# Patient Record
Sex: Male | Born: 1960 | Race: White | Hispanic: No | Marital: Single | Smoking: Never smoker
Health system: Southern US, Community
[De-identification: ages and names within clinical notes are randomized; demographics above are authoritative.]

## PROBLEM LIST (undated history)

## (undated) DIAGNOSIS — I10 Essential (primary) hypertension: Secondary | ICD-10-CM

## (undated) DIAGNOSIS — R911 Solitary pulmonary nodule: Secondary | ICD-10-CM

## (undated) DIAGNOSIS — J189 Pneumonia, unspecified organism: Secondary | ICD-10-CM

## (undated) DIAGNOSIS — M109 Gout, unspecified: Secondary | ICD-10-CM

## (undated) DIAGNOSIS — Z87442 Personal history of urinary calculi: Secondary | ICD-10-CM

## (undated) DIAGNOSIS — E785 Hyperlipidemia, unspecified: Secondary | ICD-10-CM

## (undated) HISTORY — PX: APPENDECTOMY: SHX54

---

## 2009-09-27 ENCOUNTER — Emergency Department: Payer: Self-pay | Admitting: Emergency Medicine

## 2009-10-20 ENCOUNTER — Ambulatory Visit: Payer: Self-pay | Admitting: Urology

## 2009-11-28 ENCOUNTER — Ambulatory Visit: Payer: Self-pay

## 2010-11-11 ENCOUNTER — Ambulatory Visit: Payer: Self-pay | Admitting: Family Medicine

## 2010-11-15 ENCOUNTER — Ambulatory Visit: Payer: Self-pay | Admitting: Family Medicine

## 2011-12-15 ENCOUNTER — Ambulatory Visit: Payer: Self-pay | Admitting: Specialist

## 2012-04-27 ENCOUNTER — Ambulatory Visit: Payer: Self-pay | Admitting: Gastroenterology

## 2012-05-01 LAB — PATHOLOGY REPORT

## 2012-06-01 ENCOUNTER — Ambulatory Visit: Payer: Self-pay | Admitting: Orthopedic Surgery

## 2012-09-11 ENCOUNTER — Other Ambulatory Visit: Payer: Self-pay | Admitting: Rheumatology

## 2012-09-11 LAB — SYNOVIAL CELL COUNT + DIFF, W/ CRYSTALS
Basophil: 0 %
Eosinophil: 0 %
Neutrophils: 94 %
Nucleated Cell Count: 9968 /mm3
Other Cells BF: 0 %

## 2014-05-12 ENCOUNTER — Ambulatory Visit: Payer: Self-pay | Admitting: Family Medicine

## 2017-05-10 ENCOUNTER — Observation Stay (HOSPITAL_COMMUNITY)
Admission: EM | Admit: 2017-05-10 | Discharge: 2017-05-11 | Disposition: A | Discharge status: Home / Self Care | Payer: Managed Care, Other (non HMO) | Attending: Internal Medicine | Admitting: Internal Medicine

## 2017-05-10 ENCOUNTER — Emergency Department (HOSPITAL_COMMUNITY): Payer: Managed Care, Other (non HMO)

## 2017-05-10 ENCOUNTER — Encounter (HOSPITAL_COMMUNITY): Payer: Self-pay | Admitting: Cardiology

## 2017-05-10 DIAGNOSIS — I10 Essential (primary) hypertension: Secondary | ICD-10-CM | POA: Diagnosis not present

## 2017-05-10 DIAGNOSIS — E785 Hyperlipidemia, unspecified: Secondary | ICD-10-CM | POA: Diagnosis not present

## 2017-05-10 DIAGNOSIS — R079 Chest pain, unspecified: Secondary | ICD-10-CM | POA: Diagnosis not present

## 2017-05-10 DIAGNOSIS — I4510 Unspecified right bundle-branch block: Secondary | ICD-10-CM | POA: Insufficient documentation

## 2017-05-10 DIAGNOSIS — R072 Precordial pain: Secondary | ICD-10-CM | POA: Diagnosis not present

## 2017-05-10 DIAGNOSIS — R739 Hyperglycemia, unspecified: Secondary | ICD-10-CM | POA: Insufficient documentation

## 2017-05-10 DIAGNOSIS — Z79899 Other long term (current) drug therapy: Secondary | ICD-10-CM | POA: Diagnosis not present

## 2017-05-10 HISTORY — DX: Gout, unspecified: M10.9

## 2017-05-10 HISTORY — DX: Essential (primary) hypertension: I10

## 2017-05-10 HISTORY — DX: Hyperlipidemia, unspecified: E78.5

## 2017-05-10 HISTORY — DX: Solitary pulmonary nodule: R91.1

## 2017-05-10 LAB — BASIC METABOLIC PANEL
Anion gap: 11 (ref 5–15)
BUN: 13 mg/dL (ref 6–20)
CHLORIDE: 103 mmol/L (ref 101–111)
CO2: 26 mmol/L (ref 22–32)
CREATININE: 0.92 mg/dL (ref 0.61–1.24)
Calcium: 9.3 mg/dL (ref 8.9–10.3)
Glucose, Bld: 178 mg/dL — ABNORMAL HIGH (ref 65–99)
POTASSIUM: 3.5 mmol/L (ref 3.5–5.1)
SODIUM: 140 mmol/L (ref 135–145)

## 2017-05-10 LAB — HEMOGLOBIN A1C
Hgb A1c MFr Bld: 5.5 % (ref 4.8–5.6)
MEAN PLASMA GLUCOSE: 111.15 mg/dL

## 2017-05-10 LAB — HEPATIC FUNCTION PANEL
ALBUMIN: 4.3 g/dL (ref 3.5–5.0)
ALT: 18 U/L (ref 17–63)
AST: 21 U/L (ref 15–41)
Alkaline Phosphatase: 90 U/L (ref 38–126)
BILIRUBIN TOTAL: 1 mg/dL (ref 0.3–1.2)
Bilirubin, Direct: 0.2 mg/dL (ref 0.1–0.5)
Indirect Bilirubin: 0.8 mg/dL (ref 0.3–0.9)
TOTAL PROTEIN: 8.1 g/dL (ref 6.5–8.1)

## 2017-05-10 LAB — CBC
HCT: 41.7 % (ref 39.0–52.0)
Hemoglobin: 13.9 g/dL (ref 13.0–17.0)
MCH: 28.4 pg (ref 26.0–34.0)
MCHC: 33.3 g/dL (ref 30.0–36.0)
MCV: 85.1 fL (ref 78.0–100.0)
PLATELETS: 219 10*3/uL (ref 150–400)
RBC: 4.9 MIL/uL (ref 4.22–5.81)
RDW: 14.1 % (ref 11.5–15.5)
WBC: 10.5 10*3/uL (ref 4.0–10.5)

## 2017-05-10 LAB — LIPID PANEL
CHOL/HDL RATIO: 5.5 ratio
Cholesterol: 148 mg/dL (ref 0–200)
HDL: 27 mg/dL — AB (ref >40)
LDL Cholesterol: 105 mg/dL — ABNORMAL HIGH (ref 0–99)
TRIGLYCERIDES: 81 mg/dL (ref <150)
VLDL: 16 mg/dL (ref 0–40)

## 2017-05-10 LAB — TSH: TSH: 1.222 u[IU]/mL (ref 0.350–4.500)

## 2017-05-10 LAB — LIPASE, BLOOD: Lipase: 31 U/L (ref 11–51)

## 2017-05-10 LAB — GLUCOSE, CAPILLARY: Glucose-Capillary: 131 mg/dL — ABNORMAL HIGH (ref 65–99)

## 2017-05-10 LAB — TROPONIN I: Troponin I: <0.03 ng/mL (ref <0.03)

## 2017-05-10 MED ORDER — IOPAMIDOL (ISOVUE-370) INJECTION 76%
100.0000 mL | Freq: Once | INTRAVENOUS | Status: AC | PRN
  Administered 2017-05-10: 100 mL via INTRAVENOUS

## 2017-05-10 MED ORDER — GI COCKTAIL ~~LOC~~: 30.0000 mL | Freq: Four times a day (QID) | ORAL | Status: DC | PRN

## 2017-05-10 MED ORDER — INSULIN ASPART 100 UNIT/ML ~~LOC~~ SOLN: 0.0000 [IU] | Freq: Every day | SUBCUTANEOUS | Status: DC

## 2017-05-10 MED ORDER — INSULIN ASPART 100 UNIT/ML ~~LOC~~ SOLN: 0.0000 [IU] | Freq: Three times a day (TID) | SUBCUTANEOUS | Status: DC

## 2017-05-10 MED ORDER — GI COCKTAIL ~~LOC~~
30.0000 mL | Freq: Once | ORAL | Status: AC
Start: 2017-05-10 — End: 2017-05-10
  Administered 2017-05-10: 30 mL via ORAL
  Filled 2017-05-10: qty 30

## 2017-05-10 MED ORDER — MORPHINE SULFATE (PF) 2 MG/ML IV SOLN
2.0000 mg | Freq: Once | INTRAVENOUS | Status: AC
  Administered 2017-05-10: 2 mg via INTRAVENOUS
  Filled 2017-05-10: qty 1

## 2017-05-10 MED ORDER — SODIUM CHLORIDE 0.9 % IV BOLUS (SEPSIS)
500.0000 mL | Freq: Once | INTRAVENOUS | Status: AC
  Administered 2017-05-10: 500 mL via INTRAVENOUS

## 2017-05-10 MED ORDER — NITROGLYCERIN 0.4 MG SL SUBL
0.4000 mg | SUBLINGUAL_TABLET | SUBLINGUAL | Status: DC | PRN
  Administered 2017-05-10: 0.4 mg via SUBLINGUAL
  Filled 2017-05-10: qty 1

## 2017-05-10 MED ORDER — FEBUXOSTAT 40 MG PO TABS
40.0000 mg | ORAL_TABLET | Freq: Every day | ORAL | Status: DC
  Filled 2017-05-10: qty 1

## 2017-05-10 MED ORDER — ENOXAPARIN SODIUM 40 MG/0.4ML ~~LOC~~ SOLN
40.0000 mg | SUBCUTANEOUS | Status: DC
  Administered 2017-05-10: 40 mg via SUBCUTANEOUS
  Filled 2017-05-10: qty 0.4

## 2017-05-10 MED ORDER — ASPIRIN EC 81 MG PO TBEC
81.0000 mg | DELAYED_RELEASE_TABLET | Freq: Every day | ORAL | Status: DC
  Filled 2017-05-10: qty 1

## 2017-05-10 MED ORDER — HYDRALAZINE HCL 20 MG/ML IJ SOLN: 5.0000 mg | INTRAMUSCULAR | Status: DC | PRN

## 2017-05-10 MED ORDER — ATORVASTATIN CALCIUM 20 MG PO TABS
20.0000 mg | ORAL_TABLET | Freq: Every day | ORAL | Status: DC
  Administered 2017-05-10: 20 mg via ORAL
  Filled 2017-05-10: qty 1

## 2017-05-10 MED ORDER — ONDANSETRON HCL 4 MG/2ML IJ SOLN: 4.0000 mg | Freq: Four times a day (QID) | INTRAMUSCULAR | Status: DC | PRN

## 2017-05-10 MED ORDER — SODIUM CHLORIDE 0.9 % IV SOLN
INTRAVENOUS | Status: DC
  Administered 2017-05-10: 13:00:00 via INTRAVENOUS

## 2017-05-10 MED ORDER — LOSARTAN POTASSIUM 50 MG PO TABS
100.0000 mg | ORAL_TABLET | Freq: Every day | ORAL | Status: DC
  Filled 2017-05-10: qty 2

## 2017-05-10 MED ORDER — MORPHINE SULFATE (PF) 2 MG/ML IV SOLN: 2.0000 mg | INTRAVENOUS | Status: DC | PRN

## 2017-05-10 MED ORDER — ACETAMINOPHEN 325 MG PO TABS: 650.0000 mg | ORAL_TABLET | ORAL | Status: DC | PRN

## 2017-05-10 MED ORDER — ONDANSETRON HCL 4 MG/2ML IJ SOLN
4.0000 mg | Freq: Once | INTRAMUSCULAR | Status: AC
  Administered 2017-05-10: 4 mg via INTRAVENOUS
  Filled 2017-05-10: qty 2

## 2017-05-10 NOTE — ED Triage Notes (Signed)
Chest pain since 7 am.  Had the same pain over the weekend.

## 2017-05-10 NOTE — H&P (Signed)
History and Physical    Robert LinLeslie Riebe ZOX:[id number] DOB: Feb 10, 1961 DOA: 05/10/2017  PCP: Dione Housekeeperlmedo, Mario Ernesto, MD Consultants:  Meredeth IdeFleming - rheumatology Patient coming from:  Home - lives with son; Jackey LogeOK: Shari HeritageSon, [phone]  Chief Complaint: chest pain  HPI: Robert Ross is a 56 y.o. male with medical history significant of lung nodule; HTN; HLD; and gout presenting with chest pain.  He started having chest pain and upper back pain and he was concerned about it.  He had the same problem over the weekend, took 2 ASA and it resolved.  He had a second episode just before bed one night this week and took 1 ASA; he slept well and the pain was gone when he awoke from sleep.    Today it flared again and he took 1 ASA without improvement.  He identifies the midepigastric region as the source of his pain.  He was given morphine, nitroglycerin with some relief in his back; he fell asleep and when he woke up, his chest was still hurting.  No h/o chest pain.  No stress test.  Radiates into between shoulder blades.  Nausea today, no vomiting.  +diaphoresis today.  Nothing makes it worse or better.  He was at work when the pain started.  He did not have pain while driving.  Pain started with exertion.  Pain has been constant since it started.   ED Course: EKG unchanged.  Troponin negative x 2.  CTA negative.  Morphine and NTG gave some relief but the pain persisted.  Review of Systems: As per HPI; otherwise review of systems reviewed and negative.   Ambulatory Status:  Ambulates without assistance  Past Medical History:  Diagnosis Date  . Gout   . Hyperlipidemia   . Hypertension   . Lung nodule    stable for several years    Past Surgical History:  Procedure Laterality Date  . APPENDECTOMY      Social History   Social History  . Marital status: Married    Spouse name: N/A  . Number of children: N/A  . Years of education: N/A   Occupational History  . Scientist, product/process developmentTextile worker    Social History Main  Topics  . Smoking status: Never Smoker  . Smokeless tobacco: Never Used  . Alcohol use No  . Drug use: No  . Sexual activity: Not on file   Other Topics Concern  . Not on file   Social History Narrative  . No narrative on file    Allergies  Allergen Reactions  . Allopurinol Rash    Family History  Problem Relation Age of Onset  . Lung cancer Mother 2073  . CAD Father 3960    Prior to Admission medications   Medication Sig Start Date End Date Taking? Authorizing Provider  atorvastatin (LIPITOR) 20 MG tablet Take 1 tablet by mouth daily. 04/19/17  Yes [provider]  losartan (COZAAR) 100 MG tablet Take 1 tablet by mouth daily. 03/18/17  Yes [provider]  ULORIC 40 MG tablet Take 1 tablet by mouth daily. 04/18/17  Yes [provider]    Physical Exam: Vitals:   05/10/17 1430 05/10/17 1500 05/10/17 1515 05/10/17 1530  BP: (!) 155/58 (!) 176/76  (!) 164/69  Pulse: (!) 36  (!) 57   Resp:    18  Temp:      TempSrc:      SpO2: 99% 98% 96% 98%  Weight:      Height:  General:  Appears calm and comfortable and is NAD Eyes:  PERRL, EOMI, normal lids, iris ENT:  grossly normal hearing, lips & tongue, mmm; appropriate dentition Neck:  no LAD, masses or thyromegaly; no carotid bruits Cardiovascular:  Bigeminy, rate controlled, no m/r/g. No LE edema.  Respiratory:   CTA bilaterally with no wheezes/rales/rhonchi.  Normal respiratory effort. Abdomen:  soft, NT, ND, NABS Back:   normal alignment, no CVAT Skin:  no rash or induration seen on limited exam Musculoskeletal:  grossly normal tone BUE/BLE, good ROM, no bony abnormality Psychiatric:  grossly normal mood and affect, speech fluent and appropriate, AOx3 Neurologic:  CN 2-12 grossly intact, moves all extremities in coordinated fashion, sensation intact    Radiological Exams on Admission: Dg Chest 2 View  Result Date: 05/10/2017 CLINICAL DATA:  Chest EXAM: CHEST  2 VIEW COMPARISON:   06/23/2016 FINDINGS: No cardiomegaly when accounting for rotation and apical fat pad. Subpleural density along the lateral left chest wall, stable from CT scanograms in 2012 and 2013. Similar findings were reported on 06/23/2016, which is not available for review (2016 and 2015 chest x-rays are also no longer available). There is a calcified pleural plaque along the anterolateral left chest wall at this level by CT. There is no edema, consolidation, effusion, or pneumothorax. IMPRESSION: 1. No acute finding. 2. Chronic subpleural density in the lateral left chest favoring scar. Electronically Signed   By: Marnee Spring M.D.   On: 05/10/2017 11:43   Ct Angio Chest Pe W/cm &/or Wo Cm  Result Date: 05/10/2017 CLINICAL DATA:  Chest pain, shortness of Breath EXAM: CT ANGIOGRAPHY CHEST WITH CONTRAST TECHNIQUE: Multidetector CT imaging of the chest was performed using the standard protocol during bolus administration of intravenous contrast. Multiplanar CT image reconstructions and MIPs were obtained to evaluate the vascular anatomy. CONTRAST:  100 cc Isovue 370 IV COMPARISON:  11/15/2010 FINDINGS: Cardiovascular: Heart is borderline in size. Aorta is normal caliber. No filling defects in the pulmonary arteries to suggest pulmonary emboli. Mediastinum/Nodes: No mediastinal, hilar, or axillary adenopathy. Trachea and esophagus are unremarkable. Lungs/Pleura: Mild vascular congestion. Scattered ground-glass opacities in the lungs could reflect early edema or atelectasis. No effusions. Upper Abdomen: Small gallstones layering in the gallbladder. No acute findings. Musculoskeletal: Chest wall soft tissues are unremarkable. No acute bony abnormality. Review of the MIP images confirms the above findings. IMPRESSION: No evidence of pulmonary embolus. Borderline heart size. Mild vascular congestion. Ground-glass opacities could reflect early edema or atelectasis. Cholelithiasis. Electronically Signed   By: Charlett Nose M.D.    On: 05/10/2017 15:00    EKG: Independently reviewed.  Bigeminy with rate 71; RBBB with no evidence of acute ischemia   Labs on Admission: I have personally reviewed the available labs and imaging studies at the time of the admission.  Pertinent labs:   Glucose 178 Troponin <0.03 x 2 WBC 10.5  Assessment/Plan Principal Problem:   Chest pain Active Problems:   Essential hypertension   Hyperlipidemia   Hyperglycemia   Chest pain -Patient with substernal chest pain vs. midepigastric pain that has come on intermittently for days.  It appeared to be exertional today but has been constant and not relieved with morphine or NTG. -Symptoms suggestive of noncardiac chest pain.  -CXR unremarkable.   -Initial cardiac troponin negative x 2.  -EKG not indicative of acute ischemia but does show bigeminy (also noted on telemetry/exam).   -GRACE score is 61; which predicts an in-hospital death rate of 0.2%.  -Will plan to  place in observation status on telemetry to rule out ACS by overnight observation.  -cycle troponin q6h x 3 and repeat EKG in AM -Start ASA 81 mg  daily -morphine given -Risk factor stratification with HgbA1c and FLP; will also check TSH and UDS -Cardiology consultation in AM - NPO for possible stress test  -Will plan to start Heparin drip if enzymes are positive and/or EKG changes  HTN -Takes Cozaar monotherapy at home -Patient with suboptimal control while in the ER -Reluctant to add beta blocker given bigeminy and intermittent bradycardia -Will add prn hydralazine  HLD -Continue Lipitor -Check lipids  Hyperglycemia -Glucose 178 -Check A1c -There is no indication to start medication at this time, but will cover with moderate-scale SSI for now -It appears fairly likely that he will need treatment for DM    DVT prophylaxis:  Lovenox  Code Status: Full - confirmed with patient/family Family Communication: Son present throughout hospitalization Disposition  Plan:  Home once clinically improved Consults called: Cardiology (AM)  Admission status: It is my clinical opinion that referral for OBSERVATION is reasonable and necessary in this patient based on the above information provided. The aforementioned taken together are felt to place the patient at high risk for further clinical deterioration. However it is anticipated that the patient may be medically stable for discharge from the hospital within 24 to 48 hours.    Jonah Blue MD [facility]  If note is complete, please contact covering daytime or nighttime physician. [email or web address] Password [facility]  05/10/2017, 6:21 PM

## 2017-05-10 NOTE — ED Provider Notes (Signed)
[facility]-EMERGENCY DEPT Provider Note   CSN: [id number] Arrival date & time: 05/10/17  1046     History   Chief Complaint Chief Complaint  Patient presents with  . Chest Pain    HPI Robert Ross is a 56 y.o. male.   Patient with onset of substernal chest pain today at 8 in the morning that radiated to the back between the shoulder blades. Patient states the pain is 8 out of 10. Patient had similar pain over the weekend but not as intense. Then resolved. Patient without history of any chest pain other than what occurred here recently. Cardiac risk factors include hypertension. Nonsmoker. No history of premature coronary artery disease in the family. Patient without nausea vomiting no lower extremity swelling no shortness of breath. Patient did take a full aspirin earlier today.        Past Medical History:  Diagnosis Date  . Gout   . Hypertension     There are no active problems to display for this patient.   Past Surgical History:  Procedure Laterality Date  . APPENDECTOMY         Home Medications    Prior to Admission medications   Medication Sig Start Date End Date Taking? Authorizing Provider  atorvastatin (LIPITOR) 20 MG tablet Take 1 tablet by mouth daily. 04/19/17  Yes [provider]  losartan (COZAAR) 100 MG tablet Take 1 tablet by mouth daily. 03/18/17  Yes [provider]  ULORIC 40 MG tablet Take 1 tablet by mouth daily. 04/18/17  Yes [provider]    Family History History reviewed. No pertinent family history.  Social History Social History  Substance Use Topics  . Smoking status: Never Smoker  . Smokeless tobacco: Never Used  . Alcohol use No     Allergies   Allopurinol   Review of Systems Review of Systems  Constitutional: Negative for fever.  HENT: Negative for congestion.   Eyes: Negative for redness.  Respiratory: Negative for shortness of breath.   Cardiovascular: Positive for chest pain. Negative for  leg swelling.  Gastrointestinal: Negative for abdominal pain and nausea.  Genitourinary: Negative for dysuria.  Musculoskeletal: Positive for back pain.  Skin: Negative for rash.  Neurological: Negative for headaches.  Hematological: Does not bruise/bleed easily.  Psychiatric/Behavioral: Negative for confusion.     Physical Exam Updated Vital Signs BP (!) 164/69   Pulse (!) 57   Temp 97.9 F (36.6 C) (Oral)   Resp 18   Ht 1.88 m (6\' 2" )   Wt 122.5 kg (270 lb)   SpO2 98%   BMI 34.67 kg/m   Physical Exam  Constitutional: He is oriented to person, place, and time. He appears well-developed and well-nourished. No distress.  HENT:  Head: Normocephalic and atraumatic.  Mouth/Throat: Oropharynx is clear and moist.  Eyes: Pupils are equal, round, and reactive to light. Conjunctivae and EOM are normal.  Neck: Normal range of motion. Neck supple.  Cardiovascular: Normal rate, regular rhythm and normal heart sounds.   Pulmonary/Chest: Effort normal and breath sounds normal.  Abdominal: Soft. Bowel sounds are normal.  Musculoskeletal: Normal range of motion. He exhibits no edema.  Neurological: He is alert and oriented to person, place, and time. No cranial nerve deficit or sensory deficit. He exhibits normal muscle tone. Coordination normal.  Skin: Skin is warm.  Nursing note and vitals reviewed.    ED Treatments / Results  Labs (all labs ordered are listed, but only abnormal results are displayed) Labs Reviewed  BASIC METABOLIC PANEL - Abnormal; Notable for the following:       Result Value   Glucose, Bld 178 (*)    All other components within normal limits  CBC  TROPONIN I  HEPATIC FUNCTION PANEL  LIPASE, BLOOD  TROPONIN I    EKG  EKG Interpretation  Date/Time:  Wednesday May 10 2017 10:59:48 EDT Ventricular Rate:  64 PR Interval:  160 QRS Duration: 134 QT Interval:  482 QTC Calculation: 497 R Axis:   36 Text Interpretation:  Sinus rhythm with Premature  atrial complexes in a pattern of bigeminy Right bundle branch block Abnormal ECG New since previous tracing Confirmed by Vanetta Mulders [phone]) on 05/10/2017 12:14:48 PM       Radiology Dg Chest 2 View  Result Date: 05/10/2017 CLINICAL DATA:  Chest EXAM: CHEST  2 VIEW COMPARISON:  06/23/2016 FINDINGS: No cardiomegaly when accounting for rotation and apical fat pad. Subpleural density along the lateral left chest wall, stable from CT scanograms in 2012 and 2013. Similar findings were reported on 06/23/2016, which is not available for review (2016 and 2015 chest x-rays are also no longer available). There is a calcified pleural plaque along the anterolateral left chest wall at this level by CT. There is no edema, consolidation, effusion, or pneumothorax. IMPRESSION: 1. No acute finding. 2. Chronic subpleural density in the lateral left chest favoring scar. Electronically Signed   By: Marnee Spring M.D.   On: 05/10/2017 11:43   Ct Angio Chest Pe W/cm &/or Wo Cm  Result Date: 05/10/2017 CLINICAL DATA:  Chest pain, shortness of Breath EXAM: CT ANGIOGRAPHY CHEST WITH CONTRAST TECHNIQUE: Multidetector CT imaging of the chest was performed using the standard protocol during bolus administration of intravenous contrast. Multiplanar CT image reconstructions and MIPs were obtained to evaluate the vascular anatomy. CONTRAST:  100 cc Isovue 370 IV COMPARISON:  11/15/2010 FINDINGS: Cardiovascular: Heart is borderline in size. Aorta is normal caliber. No filling defects in the pulmonary arteries to suggest pulmonary emboli. Mediastinum/Nodes: No mediastinal, hilar, or axillary adenopathy. Trachea and esophagus are unremarkable. Lungs/Pleura: Mild vascular congestion. Scattered ground-glass opacities in the lungs could reflect early edema or atelectasis. No effusions. Upper Abdomen: Small gallstones layering in the gallbladder. No acute findings. Musculoskeletal: Chest wall soft tissues are unremarkable. No acute  bony abnormality. Review of the MIP images confirms the above findings. IMPRESSION: No evidence of pulmonary embolus. Borderline heart size. Mild vascular congestion. Ground-glass opacities could reflect early edema or atelectasis. Cholelithiasis. Electronically Signed   By: Charlett Nose M.D.   On: 05/10/2017 15:00    Procedures Procedures (including critical care time)  Medications Ordered in ED Medications  0.9 %  sodium chloride infusion ( Intravenous New Bag/Given 05/10/17 1250)  nitroGLYCERIN (NITROSTAT) SL tablet 0.4 mg (0.4 mg Sublingual Given 05/10/17 1251)  sodium chloride 0.9 % bolus 500 mL (0 mLs Intravenous Stopped 05/10/17 1538)  ondansetron (ZOFRAN) injection 4 mg (4 mg Intravenous Given 05/10/17 1251)  morphine 2 MG/ML injection 2 mg (2 mg Intravenous Given 05/10/17 1251)  iopamidol (ISOVUE-370) 76 % injection 100 mL (100 mLs Intravenous Contrast Given 05/10/17 1450)     Initial Impression / Assessment and Plan / ED Course  I have reviewed the triage vital signs and the nursing notes.  Pertinent labs & imaging results that were available during my care of the patient were reviewed by me and considered in my medical decision making (see chart for details).    Patient's workup for the chest pain without  acute findings on the EKG however his EKG is changed from previous. Has right bundle branch block has some PACs. First troponin negative.Chest x-ray with evidence of an old scar CT angios was done to rule out pulmonary embolus. No evidence of that. No evidence of any dissection on the CTA of the chest.  Patient got nitroglycerin and morphine here with some improvement in the chest pain but is not resolved. Second troponin is been ordered is not back yet. Patient will require admission for rule out.   Final Clinical Impressions(s) / ED Diagnoses   Final diagnoses:  Precordial pain    New Prescriptions New Prescriptions   No medications on file     Vanetta Mulders,  MD 05/10/17 [phone]

## 2017-05-10 NOTE — ED Notes (Signed)
Pt taken to xray 

## 2017-05-10 NOTE — ED Notes (Signed)
Patient transported to CT 

## 2017-05-10 NOTE — ED Notes (Signed)
EKG given to dr Clarene Dukemcmanus

## 2017-05-10 NOTE — ED Notes (Addendum)
Radial pulse palpated for HR reading. Pulse strong but irregular.

## 2017-05-11 ENCOUNTER — Observation Stay (HOSPITAL_BASED_OUTPATIENT_CLINIC_OR_DEPARTMENT_OTHER): Payer: Managed Care, Other (non HMO)

## 2017-05-11 ENCOUNTER — Encounter (HOSPITAL_COMMUNITY): Payer: Self-pay | Admitting: Adult Health

## 2017-05-11 DIAGNOSIS — E785 Hyperlipidemia, unspecified: Secondary | ICD-10-CM | POA: Diagnosis not present

## 2017-05-11 DIAGNOSIS — E784 Other hyperlipidemia: Secondary | ICD-10-CM

## 2017-05-11 DIAGNOSIS — I10 Essential (primary) hypertension: Secondary | ICD-10-CM

## 2017-05-11 DIAGNOSIS — R072 Precordial pain: Secondary | ICD-10-CM | POA: Diagnosis not present

## 2017-05-11 DIAGNOSIS — M549 Dorsalgia, unspecified: Secondary | ICD-10-CM

## 2017-05-11 DIAGNOSIS — R739 Hyperglycemia, unspecified: Secondary | ICD-10-CM

## 2017-05-11 DIAGNOSIS — R079 Chest pain, unspecified: Secondary | ICD-10-CM

## 2017-05-11 DIAGNOSIS — R9431 Abnormal electrocardiogram [ECG] [EKG]: Secondary | ICD-10-CM

## 2017-05-11 DIAGNOSIS — E782 Mixed hyperlipidemia: Secondary | ICD-10-CM

## 2017-05-11 LAB — NM MYOCAR MULTI W/SPECT W/WALL MOTION / EF
CHL CUP NUCLEAR SRS: 0
CHL CUP NUCLEAR SSS: 1
CSEPPHR: 104 {beats}/min
LV dias vol: 119 mL (ref 62–150)
LV sys vol: 37 mL
NUC STRESS TID: 1.1
RATE: 0.29
Rest HR: 73 {beats}/min
SDS: 1

## 2017-05-11 LAB — ECHOCARDIOGRAM COMPLETE
AVLVOTPG: 8 mmHg
CHL CUP MV DEC (S): 225
CHL CUP STROKE VOLUME: 52 mL
E/e' ratio: 8.48
EWDT: 225 ms
FS: 45 % — AB (ref 28–44)
Height: 74 in
IVS/LV PW RATIO, ED: 0.99
LA ID, A-P, ES: 29 mm
LA diam end sys: 29 mm
LA vol index: 16.5 mL/m2
LADIAMINDEX: 1.13 cm/m2
LAVOL: 42.4 mL
LAVOLA4C: 37.4 mL
LDCA: 3.14 cm2
LV E/e' medial: 8.48
LV E/e'average: 8.48
LV PW d: 12.8 mm — AB (ref 0.6–1.1)
LV TDI E'LATERAL: 9.14
LV TDI E'MEDIAL: 8.27
LVDIAVOL: 73 mL (ref 62–150)
LVDIAVOLIN: 29 mL/m2
LVELAT: 9.14 cm/s
LVOT SV: 90 mL
LVOT VTI: 28.6 cm
LVOT peak vel: 141 cm/s
LVOTD: 20 mm
LVSYSVOL: 22 mL (ref 21–61)
LVSYSVOLIN: 8 mL/m2
MV Peak grad: 2 mmHg
MVPKAVEL: 75.1 m/s
MVPKEVEL: 77.5 m/s
RV TAPSE: 20.3 mm
Simpson's disk: 70
Weight: 4320 oz

## 2017-05-11 LAB — GLUCOSE, CAPILLARY
GLUCOSE-CAPILLARY: 86 mg/dL (ref 65–99)
Glucose-Capillary: 125 mg/dL — ABNORMAL HIGH (ref 65–99)
Glucose-Capillary: 100 mg/dL — ABNORMAL HIGH (ref 65–99)

## 2017-05-11 LAB — TROPONIN I: Troponin I: <0.03 ng/mL (ref <0.03)

## 2017-05-11 LAB — HIV ANTIBODY (ROUTINE TESTING W REFLEX): HIV SCREEN 4TH GENERATION: NONREACTIVE

## 2017-05-11 MED ORDER — ENOXAPARIN SODIUM 60 MG/0.6ML ~~LOC~~ SOLN: 60.0000 mg | SUBCUTANEOUS | Status: DC

## 2017-05-11 MED ORDER — CHLORTHALIDONE 25 MG PO TABS
12.5000 mg | ORAL_TABLET | Freq: Every day | ORAL | Status: DC
  Filled 2017-05-11 (×2): qty 0.5

## 2017-05-11 MED ORDER — TECHNETIUM TC 99M TETROFOSMIN IV KIT
30.0000 | PACK | Freq: Once | INTRAVENOUS | Status: AC | PRN
  Administered 2017-05-11: 30 via INTRAVENOUS

## 2017-05-11 MED ORDER — TECHNETIUM TC 99M TETROFOSMIN IV KIT
10.0000 | PACK | Freq: Once | INTRAVENOUS | Status: AC | PRN
  Administered 2017-05-11: 10 via INTRAVENOUS

## 2017-05-11 MED ORDER — ASPIRIN 81 MG PO TBEC: 81.0000 mg | DELAYED_RELEASE_TABLET | Freq: Every day | ORAL | 0 refills | Status: AC

## 2017-05-11 MED ORDER — SODIUM CHLORIDE 0.9% FLUSH
INTRAVENOUS | Status: AC
  Administered 2017-05-11: 10 mL via INTRAVENOUS
  Filled 2017-05-11: qty 10

## 2017-05-11 MED ORDER — CHLORTHALIDONE 25 MG PO TABS: 12.5000 mg | ORAL_TABLET | Freq: Every day | ORAL | 1 refills | Status: AC

## 2017-05-11 MED ORDER — REGADENOSON 0.4 MG/5ML IV SOLN
INTRAVENOUS | Status: AC
Start: 2017-05-11 — End: 2017-05-11
  Administered 2017-05-11: 0.4 mg via INTRAVENOUS
  Filled 2017-05-11: qty 5

## 2017-05-11 NOTE — Progress Notes (Signed)
*  PRELIMINARY RESULTS* Echocardiogram 2D Echocardiogram has been performed.  Stacey DrainWhite, Ashwika Freels J 05/11/2017, 2:16 PM

## 2017-05-11 NOTE — Discharge Summary (Signed)
Physician Discharge Summary  Robert Ross ZOX:[id number] DOB: [birth year] DOA: 05/10/2017  PCP: Dione Housekeeperlmedo, Mario Ernesto, MD  Admit date: 05/10/2017 Discharge date: 05/11/2017  Admitted From: HOME Disposition:  Home  Recommendations for Outpatient Follow-up:  1. Follow up with PCP in 1-2 weeks 2. Please obtain BMP/CBC in one week    Discharge Condition: Stable CODE STATUS:FULL Diet recommendation: Heart Healthy    Brief/Interim Summary: 56 year old male with a history of hypertension, hyperlipidemia, gouty arthritis presented with 1 week history of intermittent chest pain. The patient states that he performs some yard work on 05/05/2017. The morning of 05/06/2017, the patient woke up with substernal and epigastric chest pain that radiated to his back between his shoulder blades. It was relieved with aspirin. This chest pain recurred on 05/08/2017 while he was watching television, and I once again resolved with aspirin. He developed sharp chest pain once again while at work on 05/10/2017 that was not relieved with aspirin. This was associated with some nausea and diaphoresis. The patient did not describe any other exacerbating factors or alleviating factors. As result, the patient came to the emergency department for further evaluation. The patient has never smoked. There is no history of premature coronary artery disease in his family. EKG showed right bundle branch block. Troponins have been negative. CT angiogram of the chest was negative for PE and aortic dissection. Cardiology was consulted.  Discharge Diagnoses:  Chest pain -Mostly atypical, but appears to have had some exertional chest discomfort on 05/10/2017 -Consult cardiology-->lexiscan stress test--normal, EF 69% -Continue aspirin 81 mg daily -Personally reviewed EKG--sinus rhythm, RBBB -personally reviewed CXR--no edema or infiltrates -05/10/2017 CTA chest--neg for PE or dissection  Right bundle branch block -New compared  to previous EKGs -Echocardiogram--EF 65-70%, no WMA, trivial AI  Essential hypertension -Poorly controlled -Continue losartan -Hesitate to add BB secondary to intermittent bradycardia -Start chlorthalidone per cardiology  Hyperglycemia -Hemoglobin A1c 5.5 -d/c sliding scale  Hyperlipidemia -Continue statin   Discharge Instructions  Discharge Instructions    Diet - low sodium heart healthy    Complete by:  As directed    Increase activity slowly    Complete by:  As directed      Allergies as of 05/11/2017      Reactions   Allopurinol Rash      Medication List    TAKE these medications   aspirin 81 MG EC tablet Take 1 tablet (81 mg total) by mouth daily.   atorvastatin 20 MG tablet Commonly known as:  LIPITOR Take 1 tablet by mouth daily.   chlorthalidone 25 MG tablet Commonly known as:  HYGROTON Take 0.5 tablets (12.5 mg total) by mouth daily.   losartan 100 MG tablet Commonly known as:  COZAAR Take 1 tablet by mouth daily.   ULORIC 40 MG tablet Generic drug:  febuxostat Take 1 tablet by mouth daily.            Discharge Care Instructions        Start     Ordered   05/11/17 0000  chlorthalidone (HYGROTON) 25 MG tablet  Daily     05/11/17 1453   05/11/17 0000  Increase activity slowly     05/11/17 1453   05/11/17 0000  Diet - low sodium heart healthy     05/11/17 1453   05/11/17 0000  aspirin EC 81 MG EC tablet  Daily     05/11/17 1453      Allergies  Allergen Reactions  . Allopurinol Rash  Consultations:  cardiology   Procedures/Studies: Dg Chest 2 View  Result Date: 05/10/2017 CLINICAL DATA:  Chest EXAM: CHEST  2 VIEW COMPARISON:  06/23/2016 FINDINGS: No cardiomegaly when accounting for rotation and apical fat pad. Subpleural density along the lateral left chest wall, stable from CT scanograms in 2012 and 2013. Similar findings were reported on 06/23/2016, which is not available for review (2016 and 2015 chest x-rays are also  no longer available). There is a calcified pleural plaque along the anterolateral left chest wall at this level by CT. There is no edema, consolidation, effusion, or pneumothorax. IMPRESSION: 1. No acute finding. 2. Chronic subpleural density in the lateral left chest favoring scar. Electronically Signed   By: Marnee Spring M.D.   On: 05/10/2017 11:43   Ct Angio Chest Pe W/cm &/or Wo Cm  Result Date: 05/10/2017 CLINICAL DATA:  Chest pain, shortness of Breath EXAM: CT ANGIOGRAPHY CHEST WITH CONTRAST TECHNIQUE: Multidetector CT imaging of the chest was performed using the standard protocol during bolus administration of intravenous contrast. Multiplanar CT image reconstructions and MIPs were obtained to evaluate the vascular anatomy. CONTRAST:  100 cc Isovue 370 IV COMPARISON:  11/15/2010 FINDINGS: Cardiovascular: Heart is borderline in size. Aorta is normal caliber. No filling defects in the pulmonary arteries to suggest pulmonary emboli. Mediastinum/Nodes: No mediastinal, hilar, or axillary adenopathy. Trachea and esophagus are unremarkable. Lungs/Pleura: Mild vascular congestion. Scattered ground-glass opacities in the lungs could reflect early edema or atelectasis. No effusions. Upper Abdomen: Small gallstones layering in the gallbladder. No acute findings. Musculoskeletal: Chest wall soft tissues are unremarkable. No acute bony abnormality. Review of the MIP images confirms the above findings. IMPRESSION: No evidence of pulmonary embolus. Borderline heart size. Mild vascular congestion. Ground-glass opacities could reflect early edema or atelectasis. Cholelithiasis. Electronically Signed   By: Charlett Nose M.D.   On: 05/10/2017 15:00   Nm Myocar Multi W/spect W/wall Motion / Ef  Result Date: 05/11/2017  There was no ST segment deviation noted during stress.  The study is normal. No myocardial ischemia or scar.  This is a low risk study.  Nuclear stress EF: 69%.          Discharge  Exam: Vitals:   05/10/17 2355 05/11/17 0355  BP: (!) 162/61 (!) 152/68  Pulse: 64 68  Resp:  15  Temp: 99.1 F (37.3 C) 99.3 F (37.4 C)  SpO2: 98% 97%   Vitals:   05/10/17 1940 05/10/17 1955 05/10/17 2355 05/11/17 0355  BP:  (!) 167/82 (!) 162/61 (!) 152/68  Pulse:  65 64 68  Resp:  20  15  Temp:  98.7 F (37.1 C) 99.1 F (37.3 C) 99.3 F (37.4 C)  TempSrc:  Oral  Oral  SpO2: 98%  98% 97%  Weight:      Height:        General: Pt is alert, awake, not in acute distress Cardiovascular: RRR, S1/S2 +, no rubs, no gallops Respiratory: CTA bilaterally, no wheezing, no rhonchi Abdominal: Soft, NT, ND, bowel sounds + Extremities: no edema, no cyanosis   The results of significant diagnostics from this hospitalization (including imaging, microbiology, ancillary and laboratory) are listed below for reference.    Significant Diagnostic Studies: Dg Chest 2 View  Result Date: 05/10/2017 CLINICAL DATA:  Chest EXAM: CHEST  2 VIEW COMPARISON:  06/23/2016 FINDINGS: No cardiomegaly when accounting for rotation and apical fat pad. Subpleural density along the lateral left chest wall, stable from CT scanograms in 2012 and 2013. Similar findings  were reported on 06/23/2016, which is not available for review (2016 and 2015 chest x-rays are also no longer available). There is a calcified pleural plaque along the anterolateral left chest wall at this level by CT. There is no edema, consolidation, effusion, or pneumothorax. IMPRESSION: 1. No acute finding. 2. Chronic subpleural density in the lateral left chest favoring scar. Electronically Signed   By: Marnee Spring M.D.   On: 05/10/2017 11:43   Ct Angio Chest Pe W/cm &/or Wo Cm  Result Date: 05/10/2017 CLINICAL DATA:  Chest pain, shortness of Breath EXAM: CT ANGIOGRAPHY CHEST WITH CONTRAST TECHNIQUE: Multidetector CT imaging of the chest was performed using the standard protocol during bolus administration of intravenous contrast. Multiplanar CT  image reconstructions and MIPs were obtained to evaluate the vascular anatomy. CONTRAST:  100 cc Isovue 370 IV COMPARISON:  11/15/2010 FINDINGS: Cardiovascular: Heart is borderline in size. Aorta is normal caliber. No filling defects in the pulmonary arteries to suggest pulmonary emboli. Mediastinum/Nodes: No mediastinal, hilar, or axillary adenopathy. Trachea and esophagus are unremarkable. Lungs/Pleura: Mild vascular congestion. Scattered ground-glass opacities in the lungs could reflect early edema or atelectasis. No effusions. Upper Abdomen: Small gallstones layering in the gallbladder. No acute findings. Musculoskeletal: Chest wall soft tissues are unremarkable. No acute bony abnormality. Review of the MIP images confirms the above findings. IMPRESSION: No evidence of pulmonary embolus. Borderline heart size. Mild vascular congestion. Ground-glass opacities could reflect early edema or atelectasis. Cholelithiasis. Electronically Signed   By: Charlett Nose M.D.   On: 05/10/2017 15:00   Nm Myocar Multi W/spect W/wall Motion / Ef  Result Date: 05/11/2017  There was no ST segment deviation noted during stress.  The study is normal. No myocardial ischemia or scar.  This is a low risk study.  Nuclear stress EF: 69%.      Microbiology: No results found for this or any previous visit (from the past 240 hour(s)).   Labs: Basic Metabolic Panel:  Recent Labs Lab 05/10/17 1105  NA 140  K 3.5  CL 103  CO2 26  GLUCOSE 178*  BUN 13  CREATININE 0.92  CALCIUM 9.3   Liver Function Tests:  Recent Labs Lab 05/10/17 1105  AST 21  ALT 18  ALKPHOS 90  BILITOT 1.0  PROT 8.1  ALBUMIN 4.3    Recent Labs Lab 05/10/17 1105  LIPASE 31   No results for input(s): AMMONIA in the last 168 hours. CBC:  Recent Labs Lab 05/10/17 1105  WBC 10.5  HGB 13.9  HCT 41.7  MCV 85.1  PLT 219   Cardiac Enzymes:  Recent Labs Lab 05/10/17 1105 05/10/17 1546 05/10/17 1802 05/10/17 2357  05/11/17 0544  TROPONINI <0.03 <0.03 <0.03 <0.03 <0.03   BNP: Invalid input(s): POCBNP CBG:  Recent Labs Lab 05/10/17 2054 05/11/17 0735 05/11/17 1059  GLUCAP 131* 125* 100*    Time coordinating discharge:  Greater than 30 minutes  Signed:  Sita Mangen, DO [facility] Pager: [phone] 05/11/2017, 3:03 PM

## 2017-05-11 NOTE — ACP (Advance Care Planning) (Signed)
Left Advance Directive information with a family member while patient was having a test. Will follow up.

## 2017-05-11 NOTE — Progress Notes (Signed)
PROGRESS NOTE  Robert Ross [id number] DOB: [birth year] DOA: 05/10/2017 PCP: Dione Housekeeperlmedo, Mario Ernesto, MD  Brief History:  56 year old male with a history of hypertension, hyperlipidemia, gouty arthritis presented with 1 week history of intermittent chest pain. The patient states that he performs some yard work on 05/05/2017. The morning of 05/06/2017, the patient woke up with substernal and epigastric chest pain that radiated to his back between his shoulder blades. It was relieved with aspirin. This chest pain recurred on 05/08/2017 while he was watching television, and I once again resolved with aspirin. He developed sharp chest pain once again while at work on 05/10/2017 that was not relieved with aspirin. This was associated with some nausea and diaphoresis. The patient did not describe any other exacerbating factors or alleviating factors. As result, the patient came to the emergency department for further evaluation. The patient has never smoked. There is no history of premature coronary artery disease in his family. EKG showed right bundle branch block. Troponins have been negative. CT angiogram of the chest was negative for PE and aortic dissection. Cardiology was consulted.  Assessment/Plan: Chest pain -Mostly atypical, but appears to have had some exertional chest discomfort on 05/10/2017 -Consult cardiology -npo for possible stress test -Continue aspirin -Personally reviewed EKG--sinus rhythm, RBBB -personally reviewed CXR--no edema or infiltrates -05/10/2017 CTA chest--neg for PE or dissection  Right bundle branch block -New compared to previous EKGs -Echocardiogram  Essential hypertension -Poorly controlled -Continue losartan -Hesitate to add BB secondary to intermittent bradycardia -Start amlodipine  Hyperglycemia -Hemoglobin A1c 5.5 -d/c sliding scale  Hyperlipidemia -Continue statin    Disposition Plan:   Home when cleared by cardiology Family  Communication:   No Family at bedside  Consultants:  cardiology  Code Status:  FULL   DVT Prophylaxis:   [place] Lovenox   Procedures: As Listed in Progress Note Above  Antibiotics: None    Subjective: Patient denies fevers, chills, headache, chest pain, dyspnea, nausea, vomiting, diarrhea, abdominal pain, dysuria, hematuria, hematochezia, and melena.   Objective: Vitals:   05/10/17 1940 05/10/17 1955 05/10/17 2355 05/11/17 0355  BP:  (!) 167/82 (!) 162/61 (!) 152/68  Pulse:  65 64 68  Resp:  20  15  Temp:  98.7 F (37.1 C) 99.1 F (37.3 C) 99.3 F (37.4 C)  TempSrc:  Oral  Oral  SpO2: 98%  98% 97%  Weight:      Height:        Intake/Output Summary (Last 24 hours) at 05/11/17 0656 Last data filed at 05/10/17 1538  Gross per 24 hour  Intake              500 ml  Output                0 ml  Net              500 ml   Weight change:  Exam:   General:  Pt is alert, follows commands appropriately, not in acute distress  HEENT: No icterus, No thrush, No neck mass, [place]/AT  Cardiovascular: RRR, S1/S2, no rubs, no gallops  Respiratory: CTA bilaterally, no wheezing, no crackles, no rhonchi  Abdomen: Soft/+BS, non tender, non distended, no guarding  Extremities: No edema, No lymphangitis, No petechiae, No rashes, no synovitis   Data Reviewed: I have personally reviewed following labs and imaging studies Basic Metabolic Panel:  Recent Labs Lab 05/10/17 1105  NA 140  K 3.5  CL  103  CO2 26  GLUCOSE 178*  BUN 13  CREATININE 0.92  CALCIUM 9.3   Liver Function Tests:  Recent Labs Lab 05/10/17 1105  AST 21  ALT 18  ALKPHOS 90  BILITOT 1.0  PROT 8.1  ALBUMIN 4.3    Recent Labs Lab 05/10/17 1105  LIPASE 31   No results for input(s): AMMONIA in the last 168 hours. Coagulation Profile: No results for input(s): INR, PROTIME in the last 168 hours. CBC:  Recent Labs Lab 05/10/17 1105  WBC 10.5  HGB 13.9  HCT 41.7  MCV 85.1  PLT 219   Cardiac  Enzymes:  Recent Labs Lab 05/10/17 1105 05/10/17 1546 05/10/17 1802 05/10/17 2357  TROPONINI <0.03 <0.03 <0.03 <0.03   BNP: Invalid input(s): POCBNP CBG:  Recent Labs Lab 05/10/17 2054  GLUCAP 131*   HbA1C:  Recent Labs  05/10/17 1605  HGBA1C 5.5   Urine analysis: No results found for: COLORURINE, APPEARANCEUR, LABSPEC, PHURINE, GLUCOSEU, HGBUR, BILIRUBINUR, KETONESUR, PROTEINUR, UROBILINOGEN, NITRITE, LEUKOCYTESUR Sepsis Labs: (procalcitonin:4,lacticidven:4) )No results found for this or any previous visit (from the past 240 hour(s)).   Scheduled Meds: . aspirin EC  81 mg Oral Daily  . atorvastatin  20 mg Oral q1800  . enoxaparin (LOVENOX) injection  40 mg Subcutaneous Q24H  . febuxostat  40 mg Oral Daily  . insulin aspart  0-15 Units Subcutaneous TID WC  . insulin aspart  0-5 Units Subcutaneous QHS  . losartan  100 mg Oral Daily   Continuous Infusions:  Procedures/Studies: Dg Chest 2 View  Result Date: 05/10/2017 CLINICAL DATA:  Chest EXAM: CHEST  2 VIEW COMPARISON:  06/23/2016 FINDINGS: No cardiomegaly when accounting for rotation and apical fat pad. Subpleural density along the lateral left chest wall, stable from CT scanograms in 2012 and 2013. Similar findings were reported on 06/23/2016, which is not available for review (2016 and 2015 chest x-rays are also no longer available). There is a calcified pleural plaque along the anterolateral left chest wall at this level by CT. There is no edema, consolidation, effusion, or pneumothorax. IMPRESSION: 1. No acute finding. 2. Chronic subpleural density in the lateral left chest favoring scar. Electronically Signed   By: Marnee Spring M.D.   On: 05/10/2017 11:43   Ct Angio Chest Pe W/cm &/or Wo Cm  Result Date: 05/10/2017 CLINICAL DATA:  Chest pain, shortness of Breath EXAM: CT ANGIOGRAPHY CHEST WITH CONTRAST TECHNIQUE: Multidetector CT imaging of the chest was performed using the standard protocol during  bolus administration of intravenous contrast. Multiplanar CT image reconstructions and MIPs were obtained to evaluate the vascular anatomy. CONTRAST:  100 cc Isovue 370 IV COMPARISON:  11/15/2010 FINDINGS: Cardiovascular: Heart is borderline in size. Aorta is normal caliber. No filling defects in the pulmonary arteries to suggest pulmonary emboli. Mediastinum/Nodes: No mediastinal, hilar, or axillary adenopathy. Trachea and esophagus are unremarkable. Lungs/Pleura: Mild vascular congestion. Scattered ground-glass opacities in the lungs could reflect early edema or atelectasis. No effusions. Upper Abdomen: Small gallstones layering in the gallbladder. No acute findings. Musculoskeletal: Chest wall soft tissues are unremarkable. No acute bony abnormality. Review of the MIP images confirms the above findings. IMPRESSION: No evidence of pulmonary embolus. Borderline heart size. Mild vascular congestion. Ground-glass opacities could reflect early edema or atelectasis. Cholelithiasis. Electronically Signed   By: Charlett Nose M.D.   On: 05/10/2017 15:00    Robert Stidd, DO  [facility] Pager [phone]  If 7PM-7AM, please contact night-coverage [email or web address] Password [facility] 05/11/2017, 6:56 AM  LOS: 0 days

## 2017-05-11 NOTE — Consult Note (Signed)
Cardiology Consultation:   Patient ID: Robert LinLeslie Jarnigan; [id number]; 1961-06-02   Admit date: 05/10/2017 Date of Consult: 05/11/2017  Primary Care Provider: Dione Housekeeperlmedo, Mario Ernesto, MD Primary Cardiologist: New  Patient Profile:   Robert Ross is a 56 y.o. 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 Treated himself at home with aspirin with resolution. The pain recurred the evening of the following day he took another aspirin, slipped is a knot without problems Had recurrent discomfort the following morning after arriving at work, and again took aspirin without any improvement. At that point he had associated diaphoresis, he denied any dyspnea, palpitations, dizziness or weakness. He described the pain as "muscle soreness from my chest to my back", constant for several hours at a time, non-radiating. The patient states that he had done some yard work over the weekend, used and ax to chop up a stump.   On arrival to the emergency room he was found to be hypertensive with a blood pressure 183/77, heart rate 45 bpm, O2 sat 100%, he was afebrile. EKG right bundle branch block, bigeminy pattern, heart rate 64 bpm. Follow-up EKG revealed sinus rhythm right bundle branch block heart rate of 76 bpm. Troponin has been negative 4. Other pertinent labs revealed potassium of 3.5, glucose 178, creatinine 0.92, he was not found to be anemic, nor did he have leukocytosis or thrombocytopenia.  CT angios the chest revealed cholelithiasis, no evidence of PE, mild vascular congestion was noted, with ground glass opacities. Chest x-ray was negative for acute CHF or pneumonia. He was found to have a chronic subpleural density in the lateral left chest  favoring a scar.  He was treated with IV fluids, sublingual nitroglycerin, and aspirin. He was also given a GI cocktail, and morphine. He has been held nothing by mouth for possible stress test this a.m.   Past Medical History:  Diagnosis Date  . Gout   . Hyperlipidemia   . Hypertension   . Lung nodule    stable for several years    Past Surgical History:  Procedure Laterality Date  . APPENDECTOMY       Home Medications:  Prior to Admission medications   Medication Sig Start Date End Date Taking? Authorizing Provider  atorvastatin (LIPITOR) 20 MG tablet Take 1 tablet by mouth daily. 04/19/17  Yes [provider]  losartan (COZAAR) 100 MG tablet Take 1 tablet by mouth daily. 03/18/17  Yes [provider]  ULORIC 40 MG tablet Take 1 tablet by mouth daily. 04/18/17  Yes [provider]    Inpatient Medications: Scheduled Meds: . aspirin EC  81 mg Oral Daily  . atorvastatin  20 mg Oral q1800  . enoxaparin (LOVENOX) injection  60 mg Subcutaneous Q24H  . febuxostat  40 mg Oral Daily  . losartan  100 mg Oral Daily   Continuous Infusions:  PRN Meds: acetaminophen, gi cocktail, hydrALAZINE, morphine injection, ondansetron (ZOFRAN) IV  Allergies:    Allergies  Allergen Reactions  . Allopurinol Rash    Social History:   Social History   Social History  . Marital status: Married    Spouse name: N/A  . Number of children: N/A  . Years of education: N/A  Occupational History  . Scientist, product/process development    Social History Main Topics  . Smoking status: Never Smoker  . Smokeless tobacco: Never Used  . Alcohol use No  . Drug use: No  . Sexual activity: Not on file   Other Topics Concern  . Not on file   Social History Narrative  . No narrative on file    Family History:    Family History  Problem Relation Age of Onset  . Lung cancer Mother 24  . Diabetes Mother   . Hypertension Mother   . CVA Mother   . CAD Father 63  . CAD Brother         Stents. Half Brother      ROS:  Please see the history of present illness.  ROS  All other ROS reviewed and negative.     Physical Exam/Data:   Vitals:   05/10/17 1940 05/10/17 1955 05/10/17 2355 05/11/17 0355  BP:  (!) 167/82 (!) 162/61 (!) 152/68  Pulse:  65 64 68  Resp:  20  15  Temp:  98.7 F (37.1 C) 99.1 F (37.3 C) 99.3 F (37.4 C)  TempSrc:  Oral  Oral  SpO2: 98%  98% 97%  Weight:      Height:        Intake/Output Summary (Last 24 hours) at 05/11/17 0835 Last data filed at 05/10/17 1538  Gross per 24 hour  Intake              500 ml  Output                0 ml  Net              500 ml   Filed Weights   05/10/17 1059  Weight: 270 lb (122.5 kg)   Body mass index is 34.67 kg/m.  General:  Well nourished, well developed, in no acute distress. HEENT: normal Lymph: no adenopathy Neck: no JVD Endocrine:  No thryomegaly Vascular: No carotid bruits; FA pulses 2+ bilaterally without bruits  Cardiac:  normal S1, S2; RRR; no murmur  Lungs:  clear to auscultation bilaterally, no wheezing, rhonchi or rales  Abd: soft, nontender, no hepatomegaly  Ext: no edema Musculoskeletal:  No deformities, BUE and BLE strength normal and equal Skin: warm and dry  Neuro:  CNs 2-12 intact, no focal abnormalities noted Psych:  Normal affect   EKG:  The EKG was personally reviewed and demonstrates:  SR with RBBB Telemetry:  Telemetry was personally reviewed and demonstrates:  SR, with RBBB rates between 47 and 88 bpm.   Relevant CV Studies: None  Laboratory Data:  Chemistry  Recent Labs Lab 05/10/17 1105  NA 140  K 3.5  CL 103  CO2 26  GLUCOSE 178*  BUN 13  CREATININE 0.92  CALCIUM 9.3  GFRNONAA >60  GFRAA >60  ANIONGAP 11     Recent Labs Lab 05/10/17 1105  PROT 8.1  ALBUMIN 4.3  AST 21  ALT 18  ALKPHOS 90  BILITOT 1.0   Hematology  Recent Labs Lab 05/10/17 1105  WBC 10.5  RBC 4.90  HGB 13.9  HCT 41.7  MCV 85.1  MCH 28.4  MCHC 33.3  RDW  14.1  PLT 219   Cardiac Enzymes  Recent Labs Lab 05/10/17 1105 05/10/17 1546 05/10/17 1802 05/10/17 2357 05/11/17 0544  TROPONINI <0.03 <0.03 <0.03 <0.03 <0.03   No results for input(s): TROPIPOC in the last 168 hours.  BNPNo results for  input(s): BNP, PROBNP in the last 168 hours.  DDimer No results for input(s): DDIMER in the last 168 hours.  Radiology/Studies:  Dg Chest 2 View  Result Date: 05/10/2017 CLINICAL DATA:  Chest EXAM: CHEST  2 VIEW COMPARISON:  06/23/2016 FINDINGS: No cardiomegaly when accounting for rotation and apical fat pad. Subpleural density along the lateral left chest wall, stable from CT scanograms in 2012 and 2013. Similar findings were reported on 06/23/2016, which is not available for review (2016 and 2015 chest x-rays are also no longer available). There is a calcified pleural plaque along the anterolateral left chest wall at this level by CT. There is no edema, consolidation, effusion, or pneumothorax. IMPRESSION: 1. No acute finding. 2. Chronic subpleural density in the lateral left chest favoring scar. Electronically Signed   By: Marnee Spring M.D.   On: 05/10/2017 11:43   Ct Angio Chest Pe W/cm &/or Wo Cm  Result Date: 05/10/2017 CLINICAL DATA:  Chest pain, shortness of Breath EXAM: CT ANGIOGRAPHY CHEST WITH CONTRAST TECHNIQUE: Multidetector CT imaging of the chest was performed using the standard protocol during bolus administration of intravenous contrast. Multiplanar CT image reconstructions and MIPs were obtained to evaluate the vascular anatomy. CONTRAST:  100 cc Isovue 370 IV COMPARISON:  11/15/2010 FINDINGS: Cardiovascular: Heart is borderline in size. Aorta is normal caliber. No filling defects in the pulmonary arteries to suggest pulmonary emboli. Mediastinum/Nodes: No mediastinal, hilar, or axillary adenopathy. Trachea and esophagus are unremarkable. Lungs/Pleura: Mild vascular congestion. Scattered ground-glass opacities in the lungs could reflect  early edema or atelectasis. No effusions. Upper Abdomen: Small gallstones layering in the gallbladder. No acute findings. Musculoskeletal: Chest wall soft tissues are unremarkable. No acute bony abnormality. Review of the MIP images confirms the above findings. IMPRESSION: No evidence of pulmonary embolus. Borderline heart size. Mild vascular congestion. Ground-glass opacities could reflect early edema or atelectasis. Cholelithiasis. Electronically Signed   By: Charlett Nose M.D.   On: 05/10/2017 15:00    Assessment and Plan:   1. Chest pain: Atypical described as "muscle soreness" beginning in his back and feeling into his chest, constant for several hours, nonradiating to arms, abdomen, or jaw. Some associated diaphoresis but no dyspnea or other sequela of cardiac chest pain. He is currently pain-free after being treated in the emergency room. He has been up in the room walking around without any recurrence. Appears more musculoskeletal.    EKG and troponin argue against ACS. Cardiovascular risk factors include family history (Father and half brother with stents), hypertension, hypercholesterolemia, gender and age. Fortunately he does not have a history of smoking.  Will plan Lexiscan stress test,(he does not have walking shoes, only heavy work boots), for diagnostic, prognotic purposes due to CVRF.   2. Hypertension: Not well controlled currently. He does not take his blood pressure at home. He was hypertensive on arrival and has been moderately hypertensive during hospitalization based upon documentation. Currently on cozaar, 100 mg daily, will add chlorthalidone 12.5 mg daily . Echo for LV fx  3. Hyperlipidemia: Continue statin therapy.    For questions or updates, please contact [facility] Please consult [email or web address] for contact info under Cardiology/STEMI.   Signed, Joni Reining DNP, ANP, AACC 05/11/2017 8:35 AM   The patient was seen and examined, and I agree with the history,  physical exam, assessment and plan as documented above, with modifications as noted below. I have also personally reviewed all relevant documentation, old records, labs, and both radiographic and cardiovascular studies. I have also  independently interpreted old and new ECG's.  56 yr old male with hypertension and hyperlipidemia hospitalized for chest and back pain which began over the weekend. He had been kneeling down and chopping a stump with an axe and later that evening developed back pain and chest pain simultaneously, relieved with ASA. Symptoms recurred the next day and then again while at work. He said the pain is constant. He is currently asymptomatic.  Troponins normal. ECG showed sinus rhythm with a RBBB and atrial bigeminy. Other ECG's showed NSR with RBBB.  CT angio showed no PE with mild vascular congestion, ground-glass opacities, and cholelithiasis (small amount of gallstones).  Father reportedly died in his 29's of MI and he has a half brother who had coronary stents in his 74's but is also a smoker.  The patient does not smoke.  Recommendations: Symptoms are atypical for ischemic heart disease.  Cuurent 10-yr ASCVD risk is 12.9%.  Given risk factors, I would have opted for an outpatient exercise treadmill stress test. He has been NPO. Only has work boots.  Will obtain Lexiscan Myoview stress test. Optimize BP control with addition of low dose chlorthalidone.   Prentice Docker, MD, [facility]  05/11/2017 8:58 AM

## 2017-05-15 ENCOUNTER — Telehealth: Payer: Self-pay | Admitting: Cardiovascular Disease

## 2017-05-16 NOTE — Telephone Encounter (Addendum)
Per Dr Purvis Sheffield, pt had lexiscan while in-patient and it was a low risk study    1522 hrs, I reached patient's voicemail and explained that he had stress test in hospital on 05/11/17 and it was low risk.

## 2017-05-17 NOTE — Telephone Encounter (Signed)
Called pt. No answer. Left detailed message on pt's private voicemail.  

## 2017-06-06 ENCOUNTER — Emergency Department (HOSPITAL_COMMUNITY): Payer: 59

## 2017-06-06 ENCOUNTER — Encounter (HOSPITAL_COMMUNITY): Payer: Self-pay | Admitting: Emergency Medicine

## 2017-06-06 ENCOUNTER — Emergency Department (HOSPITAL_COMMUNITY)
Admission: EM | Admit: 2017-06-06 | Discharge: 2017-06-06 | Disposition: A | Discharge status: Home / Self Care | Payer: 59 | Attending: Emergency Medicine | Admitting: Emergency Medicine

## 2017-06-06 DIAGNOSIS — R079 Chest pain, unspecified: Secondary | ICD-10-CM | POA: Diagnosis present

## 2017-06-06 DIAGNOSIS — R0789 Other chest pain: Secondary | ICD-10-CM | POA: Insufficient documentation

## 2017-06-06 DIAGNOSIS — I10 Essential (primary) hypertension: Secondary | ICD-10-CM | POA: Insufficient documentation

## 2017-06-06 DIAGNOSIS — Z7982 Long term (current) use of aspirin: Secondary | ICD-10-CM | POA: Diagnosis not present

## 2017-06-06 DIAGNOSIS — Z79899 Other long term (current) drug therapy: Secondary | ICD-10-CM | POA: Diagnosis not present

## 2017-06-06 LAB — COMPREHENSIVE METABOLIC PANEL
ALBUMIN: 4 g/dL (ref 3.5–5.0)
ALK PHOS: 162 U/L — AB (ref 38–126)
ALT: 68 U/L — AB (ref 17–63)
ANION GAP: 12 (ref 5–15)
AST: 111 U/L — AB (ref 15–41)
BILIRUBIN TOTAL: 2 mg/dL — AB (ref 0.3–1.2)
BUN: 21 mg/dL — AB (ref 6–20)
CALCIUM: 8.6 mg/dL — AB (ref 8.9–10.3)
CO2: 29 mmol/L (ref 22–32)
CREATININE: 1.03 mg/dL (ref 0.61–1.24)
Chloride: 99 mmol/L — ABNORMAL LOW (ref 101–111)
GFR calc Af Amer: >60 mL/min (ref >60)
GFR calc non Af Amer: >60 mL/min (ref >60)
GLUCOSE: 132 mg/dL — AB (ref 65–99)
Potassium: 3.4 mmol/L — ABNORMAL LOW (ref 3.5–5.1)
SODIUM: 140 mmol/L (ref 135–145)
TOTAL PROTEIN: 8.1 g/dL (ref 6.5–8.1)

## 2017-06-06 LAB — CBC WITH DIFFERENTIAL/PLATELET
BASOS ABS: 0 10*3/uL (ref 0.0–0.1)
BASOS PCT: 0 %
EOS ABS: 0.1 10*3/uL (ref 0.0–0.7)
Eosinophils Relative: 1 %
HEMATOCRIT: 39.7 % (ref 39.0–52.0)
HEMOGLOBIN: 13.3 g/dL (ref 13.0–17.0)
Lymphocytes Relative: 10 %
Lymphs Abs: 1.4 10*3/uL (ref 0.7–4.0)
MCH: 28.4 pg (ref 26.0–34.0)
MCHC: 33.5 g/dL (ref 30.0–36.0)
MCV: 84.6 fL (ref 78.0–100.0)
MONOS PCT: 5 %
Monocytes Absolute: 0.7 10*3/uL (ref 0.1–1.0)
NEUTROS ABS: 11.5 10*3/uL — AB (ref 1.7–7.7)
NEUTROS PCT: 84 %
Platelets: 241 10*3/uL (ref 150–400)
RBC: 4.69 MIL/uL (ref 4.22–5.81)
RDW: 14.2 % (ref 11.5–15.5)
WBC: 13.8 10*3/uL — AB (ref 4.0–10.5)

## 2017-06-06 LAB — LIPASE, BLOOD: Lipase: 36 U/L (ref 11–51)

## 2017-06-06 LAB — TROPONIN I: Troponin I: <0.03 ng/mL (ref <0.03)

## 2017-06-06 MED ORDER — ONDANSETRON 4 MG PO TBDP: ORAL_TABLET | ORAL | 0 refills | Status: AC

## 2017-06-06 MED ORDER — PANTOPRAZOLE SODIUM 20 MG PO TBEC: 20.0000 mg | DELAYED_RELEASE_TABLET | Freq: Two times a day (BID) | ORAL | 0 refills | Status: DC

## 2017-06-06 MED ORDER — TRAMADOL HCL 50 MG PO TABS: 50.0000 mg | ORAL_TABLET | Freq: Four times a day (QID) | ORAL | 0 refills | Status: AC | PRN

## 2017-06-06 MED ORDER — ONDANSETRON HCL 4 MG/2ML IJ SOLN
4.0000 mg | Freq: Once | INTRAMUSCULAR | Status: AC
  Administered 2017-06-06: 4 mg via INTRAVENOUS
  Filled 2017-06-06: qty 2

## 2017-06-06 MED ORDER — HYDROMORPHONE HCL 1 MG/ML IJ SOLN
1.0000 mg | Freq: Once | INTRAMUSCULAR | Status: AC
  Administered 2017-06-06: 1 mg via INTRAVENOUS
  Filled 2017-06-06: qty 1

## 2017-06-06 MED ORDER — PANTOPRAZOLE SODIUM 40 MG IV SOLR
40.0000 mg | Freq: Once | INTRAVENOUS | Status: AC
  Administered 2017-06-06: 40 mg via INTRAVENOUS
  Filled 2017-06-06: qty 40

## 2017-06-06 NOTE — Discharge Instructions (Signed)
Follow up with Dr. Barnetta Chapel in [place] or his nurse practioner Michele Rockers,  call for an appointment this week.  [phone]

## 2017-06-06 NOTE — ED Triage Notes (Signed)
Onset this morning chest pain, took advil without relief.  Pain radiates into upper back,  Pt was admitted 2 weeks ago for chest pain, with no findings has had intermittent CP but not this bad

## 2017-06-07 NOTE — ED Provider Notes (Signed)
[facility]-EMER[facility]ENCY DEPT Provider Note   CSN: [id number] Arrival date & time: 06/06/17  1153     History   Chief Complaint Chief Complaint  Patient presents with  . Chest Pain    HPI Robert Ross is a 56 y.o. male.  Pt with chest pain into back.  Recent neg card workup   The history is provided by the patient.  Chest Pain   This is a recurrent problem. The current episode started 6 to 12 hours ago. The problem occurs constantly. The problem has not changed since onset.The pain is associated with movement. The pain is present in the epigastric region. The pain is at a severity of 5/10. The pain is moderate. The quality of the pain is described as dull. The pain does not radiate. Pertinent negatives include no abdominal pain, no back pain, no cough and no headaches.  Pertinent negatives for past medical history include no seizures.    Past Medical History:  Diagnosis Date  . Gout   . Hyperlipidemia   . Hypertension   . Lung nodule    stable for several years    Patient Active Problem List   Diagnosis Date Noted  . Chest pain 05/10/2017  . Essential hypertension 05/10/2017  . Hyperlipidemia 05/10/2017  . Hyperglycemia 05/10/2017    Past Surgical History:  Procedure Laterality Date  . APPENDECTOMY         Home Medications    Prior to Admission medications   Medication Sig Start Date End Date Taking? Authorizing Provider  aspirin EC 81 MG EC tablet Take 1 tablet (81 mg total) by mouth daily. 05/11/17  Yes Tat, Onalee Hua, MD  atorvastatin (LIPITOR) 20 MG tablet Take 1 tablet by mouth daily. 04/19/17  Yes [provider]  chlorthalidone (HYGROTON) 25 MG tablet Take 0.5 tablets (12.5 mg total) by mouth daily. 05/11/17  Yes Tat, Onalee Hua, MD  losartan (COZAAR) 100 MG tablet Take 1 tablet by mouth daily. 03/18/17  Yes [provider]  ULORIC 40 MG tablet Take 1 tablet by mouth daily. 04/18/17  Yes [provider]  ondansetron (ZOFRAN ODT) 4 MG  disintegrating tablet  ODT q4 hours prn nausea/vomit 06/06/17   Bethann Berkshire, MD  pantoprazole (PROTONIX) 20 MG tablet Take 1 tablet (20 mg total) by mouth 2 (two) times daily. 06/06/17   Bethann Berkshire, MD  traMADol (ULTRAM) 50 MG tablet Take 1 tablet (50 mg total) by mouth every 6 (six) hours as needed. 06/06/17   Bethann Berkshire, MD    Family History Family History  Problem Relation Age of Onset  . Lung cancer Mother 82  . Diabetes Mother   . Hypertension Mother   . CVA Mother   . CAD Father 74  . CAD Brother        Stents. Half Brother     Social History Social History  Substance Use Topics  . Smoking status: Never Smoker  . Smokeless tobacco: Never Used  . Alcohol use No     Allergies   Allopurinol   Review of Systems Review of Systems  Constitutional: Negative for appetite change and fatigue.  HENT: Negative for congestion, ear discharge and sinus pressure.   Eyes: Negative for discharge.  Respiratory: Negative for cough.   Cardiovascular: Positive for chest pain.  Gastrointestinal: Negative for abdominal pain and diarrhea.  Genitourinary: Negative for frequency and hematuria.  Musculoskeletal: Negative for back pain.  Skin: Negative for rash.  Neurological: Negative for seizures and headaches.  Psychiatric/Behavioral: Negative for  hallucinations.     Physical Exam Updated Vital Signs BP (!) 159/77 (BP Location: Left Arm)   Pulse 73   Temp (!) 97.4 F (36.3 C) (Oral)   Resp 18   Ht  (1.88 m)   Wt 121.1 kg (267 lb)   SpO2 96%   BMI 34.28 kg/m   Physical Exam  Constitutional: He is oriented to person, place, and time. He appears well-developed.  HENT:  Head: Normocephalic.  Eyes: Conjunctivae and EOM are normal. No scleral icterus.  Neck: Neck supple. No thyromegaly present.  Cardiovascular: Normal rate and regular rhythm.  Exam reveals no gallop and no friction rub.   No murmur heard. Pulmonary/Chest: No stridor. He has no wheezes. He has  no rales. He exhibits no tenderness.  Abdominal: He exhibits no distension. There is tenderness. There is no rebound.  Musculoskeletal: Normal range of motion. He exhibits no edema.  Lymphadenopathy:    He has no cervical adenopathy.  Neurological: He is oriented to person, place, and time. He exhibits normal muscle tone. Coordination normal.  Skin: No rash noted. No erythema.  Psychiatric: He has a normal mood and affect. His behavior is normal.     ED Treatments / Results  Labs (all labs ordered are listed, but only abnormal results are displayed) Labs Reviewed  CBC WITH DIFFERENTIAL/PLATELET - Abnormal; Notable for the following:       Result Value   WBC 13.8 (*)    Neutro Abs 11.5 (*)    All other components within normal limits  COMPREHENSIVE METABOLIC PANEL - Abnormal; Notable for the following:    Potassium 3.4 (*)    Chloride 99 (*)    Glucose, Bld 132 (*)    BUN 21 (*)    Calcium 8.6 (*)    AST 111 (*)    ALT 68 (*)    Alkaline Phosphatase 162 (*)    Total Bilirubin 2.0 (*)    All other components within normal limits  LIPASE, BLOOD  TROPONIN I    EKG  EKG Interpretation  Date/Time:  Tuesday June 06 2017 12:28:56 EDT Ventricular Rate:  82 PR Interval:    QRS Duration: 147 QT Interval:  445 QTC Calculation: 434 R Axis:   27 Text Interpretation:  Atrial fibrillation Right bundle branch block Inferolateral infarct, old Confirmed by Bethann Berkshire [phone]) on 06/06/2017 2:51:27 PM       Radiology Dg Chest 2 View  Result Date: 06/06/2017 CLINICAL DATA:  Chest pain. EXAM: CHEST  2 VIEW COMPARISON:  Chest x-ray dated May 10, 2017. FINDINGS: The cardiomediastinal silhouette is at the upper limits of normal in size. Normal pulmonary vascularity. No focal consolidation, pleural effusion, or pneumothorax. Stable subpleural density along the lateral left chest wall. Stable elevation of the right hemidiaphragm. No acute osseous abnormality. IMPRESSION: No  active cardiopulmonary disease. Electronically Signed   By: Obie Dredge M.D.   On: 06/06/2017 13:03   US Abdomen Complete  Result Date: 06/06/2017 CLINICAL DATA:  Abdominal pain EXAM: ABDOMEN ULTRASOUND COMPLETE COMPARISON:  05/12/2014 abdominal CT FINDINGS: Gallbladder: Mid level echoes with variable appearance consistent with sludge. There was no color Doppler within the sludge to suggest mass. At least 1 nonshadowing mobile echogenic structure consistent with stone. No wall thickening or focal tenderness per sonographer exam. Common bile duct: Diameter: 6 mm Liver: Borderline increased echogenicity of the liver. Limited visualization due to narrow sonographic windows. No focal abnormality seen. Portal vein is patent on color Doppler imaging with  normal direction of blood flow towards the liver. IVC: No abnormality visualized. Pancreas: Limited with no pathologic finding. Spleen: Size and appearance within normal limits. Right Kidney: Length: 13.3 cm. Echogenicity within normal limits. No mass or hydronephrosis visualized. Left Kidney: Length: 13.6 cm. Echogenicity within normal limits. No mass or hydronephrosis visualized. Abdominal aorta: Mid abdominal aorta is obscured due to gas. There is atheromatous changes without visualized aneurysm. IMPRESSION: 1. Gallbladder sludge and at least 1 nonshadowing calculus. No indication of acute cholecystitis. 2. Aortic atherosclerosis. Electronically Signed   By: Marnee Spring M.D.   On: 06/06/2017 13:57    Procedures Procedures (including critical care time)  Medications Ordered in ED Medications  pantoprazole (PROTONIX) injection 40 mg (40 mg Intravenous Given 06/06/17 1241)  HYDROmorphone (DILAUDID) injection 1 mg (1 mg Intravenous Given 06/06/17 1241)  ondansetron (ZOFRAN) injection 4 mg (4 mg Intravenous Given 06/06/17 1241)     Initial Impression / Assessment and Plan / ED Course  I have reviewed the triage vital signs and the nursing  notes.  Pertinent labs & imaging results that were available during my care of the patient were reviewed by me and considered in my medical decision making (see chart for details).     I spoke with GI ,  Pt will be put on ppi and gi follow up.  Noncardiac cause of pain  Final Clinical Impressions(s) / ED Diagnoses   Final diagnoses:  Atypical chest pain    New Prescriptions Discharge Medication List as of 06/06/2017  3:33 PM    START taking these medications   Details  ondansetron (ZOFRAN ODT) 4 MG disintegrating tablet  ODT q4 hours prn nausea/vomit, Print    pantoprazole (PROTONIX) 20 MG tablet Take 1 tablet (20 mg total) by mouth 2 (two) times daily., Starting Tue 06/06/2017, Print    traMADol (ULTRAM) 50 MG tablet Take 1 tablet (50 mg total) by mouth every 6 (six) hours as needed., Starting Tue 06/06/2017, Print         Bethann Berkshire, MD 06/07/17 [phone]

## 2017-06-08 ENCOUNTER — Other Ambulatory Visit: Payer: Self-pay | Admitting: General Surgery

## 2017-06-08 ENCOUNTER — Telehealth: Payer: Self-pay | Admitting: General Surgery

## 2017-06-08 ENCOUNTER — Telehealth: Payer: Self-pay | Admitting: Gastroenterology

## 2017-06-08 DIAGNOSIS — K8050 Calculus of bile duct without cholangitis or cholecystitis without obstruction: Secondary | ICD-10-CM

## 2017-06-08 DIAGNOSIS — K8070 Calculus of gallbladder and bile duct without cholecystitis without obstruction: Secondary | ICD-10-CM

## 2017-06-08 NOTE — Telephone Encounter (Signed)
This is a patient of the [facility] GI clinic. If the [facility] GI office thinks this patient needs an ERCP and does not have a physician who performs that procedure, then I recommend they should contact [facility] to make arrangements.

## 2017-06-08 NOTE — Telephone Encounter (Signed)
Please see Dr. Myrtie Neither' recommendations.

## 2017-06-08 NOTE — Telephone Encounter (Signed)
Routed to Dr. Myrtie Neither, notes put in his office.

## 2017-06-08 NOTE — Telephone Encounter (Signed)
Patient was contacted for discussion of laboratory results. The patient was oriented about the finding of elevated bilirubin from 2 to 8 with persistent elevated WBC at 12.9. Due to this significant increase I oriented the patient about the need of coming to the hospital to start on IVF and antibiotic therapy and accelerate the process of the MRCP and possible ERCP. Patient verbalized and understood. Will put the admission orders.

## 2017-06-08 NOTE — Telephone Encounter (Signed)
Spoke w/Ebony @ [facility] and she said we could cancel the referral.

## 2017-06-08 NOTE — H&P (Signed)
Robert Ross is a 56 y.o. male who presents to the Clinic for consultation at the request of Jacqulyn Liner for evaluation of gallbladder disease.  PCP:  Valera Castle, MD  HISTORY OF PRESENT ILLNESS: Robert Ross comes to the clinic complaining of "chest pain". Patient refers that he has had multiple of dose episodes of "chest pain", two of them described as severe pain. The pain start on the epigastric area and then radiates to the back. Pain does not radiate to the right or left abdomen. Denies nausea or vomiting during pain episode. Pain is not associated with food intake. Pain does not wake up patient from sleep. The two episodes patient has been on the ED. They evaluated for cardiac etiology which everything has been negative. Two days ago, patient went to ED and an abdominal sonogram was done showing gallbladder sludge and stones, no pericholecystic fluid, no gallbladder wall thickening, negative Murphy's sign, CBD measured 26m. The bilirubin on that visit was at 2 with elevated AST, ALT, ALK. Pain improved with antiacid medications. Pain killers does not improve pain. Patient cannot recall anything that makes pain worse. Today at the visit, the patient is asymptomatic. Patient refers that her sclera and skin has not changed from his normal appearance. Denies fever or chills. Refers tolerating diet and having regular bowel movement. Denies diarrhea.    PROBLEM LIST:        Problem List  Date Reviewed: 05/26/2017         Noted   Essential hypertension 05/06/2014   Essential hypertension, benign 01/02/2012   Lymphadenitis, chronic 01/02/2012   Elevated blood pressure (not hypertension) Unknown   History of nephrolithiasis Unknown   Obesity, unspecified Unknown      GENERAL REVIEW OF SYSTEMS:   General ROS: negative for - chills, fatigue, fever, weight gain or weight loss Allergy and Immunology ROS: negative for - hives  Hematological and Lymphatic ROS: negative for - bleeding  problems or bruising, negative for palpable nodes Endocrine ROS: negative for - heat or cold intolerance, hair changes Respiratory ROS: negative for - cough, shortness of breath or wheezing Cardiovascular ROS: no chest pain or palpitations GI ROS: negative for nausea, vomiting, diarrhea, constipation. See HPI for pertinent positives.  Musculoskeletal ROS: negative for - joint swelling or muscle pain Neurological ROS: negative for - confusion, syncope Dermatological ROS: negative for pruritus and rash  MEDICATIONS: CurrentMedications        Current Outpatient Prescriptions  Medication Sig Dispense Refill  . aspirin 81 MG EC tablet Take 1 tablet by mouth once daily.    .Marland Kitchenatorvastatin (LIPITOR) 20 MG tablet TAKE 1 TABLET(20 MG) BY MOUTH EVERY DAY 30 tablet 0  . chlorthalidone 25 MG tablet Take 12.5 mg by mouth once daily.    .Marland Kitchenlosartan (COZAAR) 100 MG tablet Take 1 tablet by mouth once daily.    . polyethylene glycol (MIRALAX) powder AS DIRECTED FOR COLONIC PREP. 255 g 0  . ULORIC 40 mg tablet TAKE 1 TABLET(40 MG) BY MOUTH EVERY DAY 30 tablet 0   No current facility-administered medications for this visit.       ALLERGIES: Allopurinol  PAST MEDICAL HISTORY:     Past Medical History:  Diagnosis Date  . COPD (chronic obstructive pulmonary disease) , unspecified (CMS-HCC)   . Elevated blood pressure (not hypertension)   . Gout   . History of nephrolithiasis   . 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 Hypertension   . Low mean corpuscular volume   . Obesity, unspecified   . Osteoarthritis   . Pulmonary nodules    multiple, bilateral, calcified and noncalcified; we have been aware aware of nodules since 2012    PAST SURGICAL HISTORY:      Past Surgical History:  Procedure Laterality Date  . APPENDECTOMY    . COLONOSCOPY  04/27/2012  . nephrolithiasis       FAMILY HISTORY:       Family History  Problem Relation Age of Onset  . High blood pressure (Hypertension) Mother   . Diabetes type II Mother   . Stroke Mother   . Myocardial Infarction (Heart attack) Father   . Gallbladder disease Other   . Arthritis Other      SOCIAL HISTORY: Social History        Social History  . Marital status: Single    Spouse name: N/A  . Number of children: N/A  . Years of education: N/A        Social History Main Topics  . Smoking status: Never Smoker  . Smokeless tobacco: Never Used  . Alcohol use No  . Drug use: No  . Sexual activity: Yes    Partners: Female    Birth control/ protection: None       Other Topics Concern  . Not on file      Social History Narrative  . No narrative on file    PHYSICAL EXAM:    Vitals:   06/08/17 0811  BP: 118/69  Pulse: 89  Temp: 36.1 C (97 F)    Ht:190.5 cm (6' 3" ) Wt:(!) 121.1 kg (267 lb) OTL:XBWI surface area is 2.53 meters squared.  General Appearance:    Alert, cooperative, no distress, appears stated age  Head:     Atraumatic, normocephalic  Eyes:   no erythema, no secretions, yellowish sclera (patient refers is his usual appearance)  Neck:   Supple, symmetrical, no JVD, no palpable lymph nodes  Mouth:   Lips, mucosa, and tongue normal;   Lungs:     Clear to auscultation bilaterally, respirations unlabored   Heart:    Regular rate and rhythm, S1 and S2 normal, no murmur, rub   or gallop  Abdomen:     Soft, non-tender, bowel sounds active all four quadrants,    no masses, no organomegaly  Extremities:   Extremities normal, atraumatic, no cyanosis or edema  Skin:   Skin color, texture, turgor normal, no rashes or lesions   Neurologic:   Grossly intact.  REVIEW OF DATA: I have reviewed the following data today:      No visits with results within 3 Month(s) from this visit.  Latest known visit with results is:  Appointment on 12/27/2016  Component Date Value  . Uric Acid 12/27/2016 3.2*   . Creatinine 12/27/2016 0.9   . Glomerular Filtration Ra* 12/27/2016 87     ASSESSMENT: Robert Ross is a 56 y.o. male presenting for consultation for gallbladder disease.  Upon evaluation patient was found with sign and symptoms that may be from the cholelithiasis (epigastric pain that radiates to the back), but I am concerned about the elevated bilirubin with elevated alkaline phosphatase, AST, ALT with mild dilation of CBD (23m). Patient was oriented about the sonogram findings and labs. I explained the patient what are the worrisome implications of the possibility of having sludge or stone on the CBD and why I am sending MRCP and new labs  before coordinating cholecystectomy. I oriented the patient about the possibility of ERCP if MRCP is positive or if bilirubin still elevated (even if MRCP is negative).   The new labs shows a significant elevation of bilirubin from 2 to 8. This finding is worrisome of CBD occlusion. To avoid complication of cholangitis, patient was contacted to be admitted and will start on IVF and Abx therapy before the MRCP and possible ERCP.   PLAN: 1. Admit to observation 2. Start IVF and antibiotics 3. MRCP  Patient verbalized understanding, all questions were answered, and were agreeable with the plan outlined above.   Herbert Pun, MD  Electronically signed by Herbert Pun, MD

## 2017-06-09 ENCOUNTER — Telehealth: Payer: Self-pay | Admitting: General Surgery

## 2017-06-09 ENCOUNTER — Ambulatory Visit
Admission: RE | Admit: 2017-06-09 | Discharge: 2017-06-09 | Disposition: A | Discharge status: Home / Self Care | Payer: 59 | Source: Ambulatory Visit | Attending: General Surgery | Admitting: General Surgery

## 2017-06-09 ENCOUNTER — Observation Stay: Admission: AD | Admit: 2017-06-09 | Payer: 59 | Source: Ambulatory Visit | Admitting: General Surgery

## 2017-06-09 DIAGNOSIS — K8070 Calculus of gallbladder and bile duct without cholecystitis without obstruction: Secondary | ICD-10-CM

## 2017-06-09 DIAGNOSIS — K8050 Calculus of bile duct without cholangitis or cholecystitis without obstruction: Secondary | ICD-10-CM

## 2017-06-09 DIAGNOSIS — K5730 Diverticulosis of large intestine without perforation or abscess without bleeding: Secondary | ICD-10-CM | POA: Diagnosis not present

## 2017-06-09 MED ORDER — GADOBENATE DIMEGLUMINE 529 MG/ML IV SOLN
20.0000 mL | Freq: Once | INTRAVENOUS | Status: AC | PRN
  Administered 2017-06-09: 20 mL via INTRAVENOUS

## 2017-06-09 NOTE — Telephone Encounter (Signed)
I contacted Mr. Mosqueda to let him know about the MRCP and the new labs results. The MRCP is negative for CBD stones and no intra or extra hepatic duct dilation. The bilirubin was wound to decrease from 8 to 3. This findings are consistent with the passage of a stone. Upon multiple conversation with the patient since yesterday he has referred that there is no new episode of abdominal pain, no fever, and is tolerating diet. Since the CBC show mild leukocytosis oral cipro and flagyl were prescribed. At this moment patient does not need to be treated as inpatient. Patient was oriented that he will be followed at the clinic early next week to coordinate laparoscopic cholecystectomy. Again patient was oriented about important sign and symptoms that he has to be aware. Patient verbalized that he understood our conversation and treatment plan.

## 2017-06-16 ENCOUNTER — Ambulatory Visit: Payer: Self-pay | Admitting: General Surgery

## 2017-06-16 NOTE — H&P (View-Only) (Signed)
HISTORY OF PRESENT ILLNESS:  Patient was previously seen due to cholelithiasis. On last visit, new labs were ordered and patient had a increase on bilirubin levels from 2 to 8. An MRCP was done and found no choledocholithiasis and follow up labs showed a significant decrease of bilirubin from 8 to 3. Since my last visit patient has been completely asymptomatic. No abdominal pain, no nausea, no vomiting, no fever. Patient refers that last week his urine was dark but since yesterday it has cleared up.   PROBLEM LIST:         Problem List  Date Reviewed: 06/08/2017         Noted   Essential hypertension 05/06/2014   Essential hypertension, benign 01/02/2012   Lymphadenitis, chronic 01/02/2012   Elevated blood pressure (not hypertension) Unknown   History of nephrolithiasis Unknown   Obesity, unspecified Unknown      GENERAL REVIEW OF SYSTEMS:   General ROS: negative for - chills, fatigue, fever, weight gain or weight loss Allergy and Immunology ROS: negative for - hives  Hematological and Lymphatic ROS: negative for - bleeding problems or bruising, negative for palpable nodes Endocrine ROS: negative for - heat or cold intolerance, hair changes Respiratory ROS: negative for - cough, shortness of breath or wheezing Cardiovascular ROS: no chest pain or palpitations GI ROS: negative for nausea, vomiting, abdominal pain, diarrhea, constipation Musculoskeletal ROS: negative for - joint swelling or muscle pain Neurological ROS: negative for - confusion, syncope Dermatological ROS: negative for pruritus and rash  MEDICATIONS: CurrentMedications        Current Outpatient Medications  Medication Sig Dispense Refill  . aspirin 81 MG EC tablet Take 1 tablet by mouth once daily.    Marland Kitchen atorvastatin (LIPITOR) 20 MG tablet TAKE 1 TABLET BY MOUTH EVERY DAY 15 tablet 0  . chlorthalidone 25 MG tablet Take 12.5 mg by mouth once daily.    . ciprofloxacin HCl (CIPRO) 500 MG tablet Take 1  tablet (500 mg total) by mouth 2 (two) times daily for 7 days. 14 tablet 0  . losartan (COZAAR) 100 MG tablet Take 1 tablet by mouth once daily.    . metroNIDAZOLE (FLAGYL) 500 MG tablet Take 1 tablet (500 mg total) by mouth 3 (three) times daily for 7 days. 21 tablet 0  . polyethylene glycol (MIRALAX) powder AS DIRECTED FOR COLONIC PREP. 255 g 0  . ULORIC 40 mg tablet TAKE 1 TABLET(40 MG) BY MOUTH EVERY DAY 30 tablet 0   No current facility-administered medications for this visit.       ALLERGIES: Allopurinol  PAST MEDICAL HISTORY:     Past Medical History:  Diagnosis Date  . COPD (chronic obstructive pulmonary disease) , unspecified (CMS-HCC)   . Elevated blood pressure (not hypertension)   . Gout   . History of nephrolithiasis   . 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 Hyperlipidemia, unspecified   . Hypertension   . Low mean corpuscular volume   . Obesity, unspecified   . Osteoarthritis   . Pulmonary nodules    multiple, bilateral, calcified and noncalcified; we have been aware aware of nodules since 2012    PAST SURGICAL HISTORY: Past Surgical History:  Procedure Laterality Date  . APPENDECTOMY    . COLONOSCOPY  04/27/2012  . nephrolithiasis       FAMILY HISTORY:      Family History  Problem Relation Age of Onset  .  High blood pressure (Hypertension) Mother   . Diabetes type II Mother   . Stroke Mother   . Myocardial Infarction (Heart attack) Father   . Gallbladder disease Other   . Arthritis Other      SOCIAL HISTORY: Social History        Socioeconomic History  . Marital status: Single    Spouse name: None  . Number of children: None  . Years of education: None  . Highest education level: None  Social Needs  . Financial resource strain: None  . Food insecurity - worry: None  . Food insecurity - inability: None  . Transportation needs - medical: None   . Transportation needs - non-medical: None  Occupational History  . None  Tobacco Use  . Smoking status: Never Smoker  . Smokeless tobacco: Never Used  Substance and Sexual Activity  . Alcohol use: No    Alcohol/week: 0.0 oz  . Drug use: No  . Sexual activity: Not Currently    Partners: Female    Birth control/protection: None  Other Topics Concern  . None  Social History Narrative  . None    PHYSICAL EXAM:    Vitals:   06/14/17 1341  BP: 118/74  Pulse: 79  Temp: 36.9 C (98.5 F)   Body mass index is 33.37 kg/m. Weight: (!) 121.1 kg (267 lb)   General Appearance:    Alert, cooperative, no distress, appears stated age  Head:     Atraumatic, normocephalic  Eyes:   Anciteric, no erythema, no secretions  Neck:   Supple, symmetrical, no JVD, no palpable lymph nodes  Mouth:   Lips, mucosa, and tongue normal;   Lungs:     Clear to auscultation bilaterally, respirations unlabored   Heart:    Regular rate and rhythm, S1 and S2 normal, no murmur, rub   or gallop  Abdomen:     Soft, non-tender, bowel sounds active all four quadrants,    no masses, no organomegaly  Extremities:   Extremities normal, atraumatic, no cyanosis or edema  Skin:   Skin color, texture, turgor normal, no rashes or lesions   Neurologic:   Grossly intact.    REVIEW OF DATA: I have reviewed the following data today:      Appointment on 06/09/2017  Component Date Value  . Bilirubin, Conjugated 06/09/2017 1.70*  . WBC (White Blood Cell Co* 06/09/2017 13.2*  . RBC (Red Blood Cell Coun* 06/09/2017 5.00   . Hemoglobin 06/09/2017 14.2   . Hematocrit 06/09/2017 42.4   . MCV (Mean Corpuscular Vo* 06/09/2017 84.8   . MCH (Mean Corpuscular He* 06/09/2017 28.4   . MCHC (Mean Corpuscular H* 06/09/2017 33.5   . Platelet Count 06/09/2017 328   . RDW-CV (Red Cell Distrib* 06/09/2017 14.2   . MPV (Mean Platelet Volum* 06/09/2017 8.8*  . Neutrophils 06/09/2017 9.61*  . Lymphocytes 06/09/2017 2.73    . Monocytes 06/09/2017 0.46   . Eosinophils 06/09/2017 0.31   . Basophils 06/09/2017 0.04   . Neutrophil % 06/09/2017 72.9*  . Lymphocyte % 06/09/2017 20.7   . Monocyte % 06/09/2017 3.5*  . Eosinophil % 06/09/2017 2.4   . Basophil% 06/09/2017 0.3   . Immature Granulocyte % 06/09/2017 0.2   . Immature Granulocyte Cou* 06/09/2017 0.03   . Glucose 06/09/2017 194*  . Sodium 06/09/2017 134*  . Potassium 06/09/2017 3.2*  . Chloride 06/09/2017 96*  . Carbon Dioxide (CO2) 06/09/2017 28.1   . Urea Nitrogen (BUN) 06/09/2017 32*  . Creatinine  06/09/2017 1.6*  . Glomerular Filtration Ra* 06/09/2017 45*  . Calcium 06/09/2017 9.4   . AST  06/09/2017 70*  . ALT  06/09/2017 138*  . Alk Phos (alkaline Phosp* 06/09/2017 260*  . Albumin 06/09/2017 4.1   . Bilirubin, Total 06/09/2017 3.1*  . Protein, Total 06/09/2017 7.8   . A/G Ratio 06/09/2017 1.1   Initial consult on 06/08/2017  Component Date Value  . WBC (White Blood Cell Co* 06/08/2017 12.8*  . RBC (Red Blood Cell Coun* 06/08/2017 5.21   . Hemoglobin 06/08/2017 14.8   . Hematocrit 06/08/2017 44.0   . MCV (Mean Corpuscular Vo* 06/08/2017 84.5   . MCH (Mean Corpuscular He* 06/08/2017 28.4   . MCHC (Mean Corpuscular H* 06/08/2017 33.6   . Platelet Count 06/08/2017 281   . RDW-CV (Red Cell Distrib* 06/08/2017 14.1   . MPV (Mean Platelet Volum* 06/08/2017 8.7*  . Neutrophils 06/08/2017 10.27*  . Lymphocytes 06/08/2017 1.44   . Monocytes 06/08/2017 0.90   . Eosinophils 06/08/2017 0.13   . Basophils 06/08/2017 0.04   . Neutrophil % 06/08/2017 80.3*  . Lymphocyte % 06/08/2017 11.2   . Monocyte % 06/08/2017 7.0   . Eosinophil % 06/08/2017 1.0   . Basophil% 06/08/2017 0.3   . Immature Granulocyte % 06/08/2017 0.2   . Immature Granulocyte Cou* 06/08/2017 0.03   . Glucose 06/08/2017 159*  . Sodium 06/08/2017 137   . Potassium 06/08/2017 3.2*  . Chloride 06/08/2017 96*  . Carbon Dioxide (CO2) 06/08/2017 32.5*  . Urea Nitrogen (BUN)  06/08/2017 17   . Creatinine 06/08/2017 1.3   . Glomerular Filtration Ra* 06/08/2017 57*  . Calcium 06/08/2017 9.3   . AST  06/08/2017 107*  . ALT  06/08/2017 180*  . Alk Phos (alkaline Phosp* 06/08/2017 265*  . Albumin 06/08/2017 4.2   . Bilirubin, Total 06/08/2017 8.7*  . Protein, Total 06/08/2017 7.9   . A/G Ratio 06/08/2017 1.1   . Bilirubin, Conjugated 06/08/2017 6.10*     ASSESSMENT: Robert Ross is a 56 y.o. male presenting for symptomatic cholelithiasis. Patient had an episode of choledocholithiasis with increased bilirubin. The stone most likely passed spontaneously because at the time of the MRCP there was no sign of stone on the CBD and no intra- or extra-hepatic duct dilation. The bilirubin was again re-checked after MRCP and decreased significantly, consistent with the resolution of the biliary duct obstruction. Patient continue asymptomatic since last visit.   Patient was oriented about all this findings and about the recommendations of surgical management to remove the gallbladder to avoid another episode of choledocholithiasis. Patient was oriented about surgical procedure (laparoscopic and open cholecystectomy), benefits and risks such as bile duct injury, bleeding, infection, hollow viscus organ injury, anesthesia complications (myocardial infarction, pneumonia, reaction to medications). Patient was oriented about the possibility of needing ERCP before or even after the surgery due to retained stone on the CBD.   PLAN: 1.Avoid fatty food 2.CBC, CMP next week 3.EKG, Internal Medicine clearance.  4.Continue antibiotics as prescribed 5.Follow Clinic Staff instruction regarding pre operative process.   Patient verbalized understanding, all questions were answered, and were agreeable with the plan outlined above.   Herbert Pun, MD

## 2017-06-16 NOTE — H&P (Signed)
HISTORY OF PRESENT ILLNESS:  Patient was previously seen due to cholelithiasis. On last visit, new labs were ordered and patient had a increase on bilirubin levels from 2 to 8. An MRCP was done and found no choledocholithiasis and follow up labs showed a significant decrease of bilirubin from 8 to 3. Since my last visit patient has been completely asymptomatic. No abdominal pain, no nausea, no vomiting, no fever. Patient refers that last week his urine was dark but since yesterday it has cleared up.   PROBLEM LIST:         Problem List  Date Reviewed: 06/08/2017         Noted   Essential hypertension 05/06/2014   Essential hypertension, benign 01/02/2012   Lymphadenitis, chronic 01/02/2012   Elevated blood pressure (not hypertension) Unknown   History of nephrolithiasis Unknown   Obesity, unspecified Unknown      GENERAL REVIEW OF SYSTEMS:   General ROS: negative for - chills, fatigue, fever, weight gain or weight loss Allergy and Immunology ROS: negative for - hives  Hematological and Lymphatic ROS: negative for - bleeding problems or bruising, negative for palpable nodes Endocrine ROS: negative for - heat or cold intolerance, hair changes Respiratory ROS: negative for - cough, shortness of breath or wheezing Cardiovascular ROS: no chest pain or palpitations GI ROS: negative for nausea, vomiting, abdominal pain, diarrhea, constipation Musculoskeletal ROS: negative for - joint swelling or muscle pain Neurological ROS: negative for - confusion, syncope Dermatological ROS: negative for pruritus and rash  MEDICATIONS: CurrentMedications        Current Outpatient Medications  Medication Sig Dispense Refill  . aspirin 81 MG EC tablet Take 1 tablet by mouth once daily.    Marland Kitchen atorvastatin (LIPITOR) 20 MG tablet TAKE 1 TABLET BY MOUTH EVERY DAY 15 tablet 0  . chlorthalidone 25 MG tablet Take 12.5 mg by mouth once daily.    . ciprofloxacin HCl (CIPRO) 500 MG tablet Take 1  tablet (500 mg total) by mouth 2 (two) times daily for 7 days. 14 tablet 0  . losartan (COZAAR) 100 MG tablet Take 1 tablet by mouth once daily.    . metroNIDAZOLE (FLAGYL) 500 MG tablet Take 1 tablet (500 mg total) by mouth 3 (three) times daily for 7 days. 21 tablet 0  . polyethylene glycol (MIRALAX) powder AS DIRECTED FOR COLONIC PREP. 255 g 0  . ULORIC 40 mg tablet TAKE 1 TABLET(40 MG) BY MOUTH EVERY DAY 30 tablet 0   No current facility-administered medications for this visit.       ALLERGIES: Allopurinol  PAST MEDICAL HISTORY:     Past Medical History:  Diagnosis Date  . COPD (chronic obstructive pulmonary disease) , unspecified (CMS-HCC)   . Elevated blood pressure (not hypertension)   . Gout   . History of nephrolithiasis   . 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 Hyperlipidemia, unspecified   . Hypertension   . Low mean corpuscular volume   . Obesity, unspecified   . Osteoarthritis   . Pulmonary nodules    multiple, bilateral, calcified and noncalcified; we have been aware aware of nodules since 2012    PAST SURGICAL HISTORY: Past Surgical History:  Procedure Laterality Date  . APPENDECTOMY    . COLONOSCOPY  04/27/2012  . nephrolithiasis       FAMILY HISTORY:      Family History  Problem Relation Age of Onset  .  High blood pressure (Hypertension) Mother   . Diabetes type II Mother   . Stroke Mother   . Myocardial Infarction (Heart attack) Father   . Gallbladder disease Other   . Arthritis Other      SOCIAL HISTORY: Social History        Socioeconomic History  . Marital status: Single    Spouse name: None  . Number of children: None  . Years of education: None  . Highest education level: None  Social Needs  . Financial resource strain: None  . Food insecurity - worry: None  . Food insecurity - inability: None  . Transportation needs - medical: None   . Transportation needs - non-medical: None  Occupational History  . None  Tobacco Use  . Smoking status: Never Smoker  . Smokeless tobacco: Never Used  Substance and Sexual Activity  . Alcohol use: No    Alcohol/week: 0.0 oz  . Drug use: No  . Sexual activity: Not Currently    Partners: Female    Birth control/protection: None  Other Topics Concern  . None  Social History Narrative  . None    PHYSICAL EXAM:    Vitals:   06/14/17 1341  BP: 118/74  Pulse: 79  Temp: 36.9 C (98.5 F)   Body mass index is 33.37 kg/m. Weight: (!) 121.1 kg (267 lb)   General Appearance:    Alert, cooperative, no distress, appears stated age  Head:     Atraumatic, normocephalic  Eyes:   Anciteric, no erythema, no secretions  Neck:   Supple, symmetrical, no JVD, no palpable lymph nodes  Mouth:   Lips, mucosa, and tongue normal;   Lungs:     Clear to auscultation bilaterally, respirations unlabored   Heart:    Regular rate and rhythm, S1 and S2 normal, no murmur, rub   or gallop  Abdomen:     Soft, non-tender, bowel sounds active all four quadrants,    no masses, no organomegaly  Extremities:   Extremities normal, atraumatic, no cyanosis or edema  Skin:   Skin color, texture, turgor normal, no rashes or lesions   Neurologic:   Grossly intact.    REVIEW OF DATA: I have reviewed the following data today:      Appointment on 06/09/2017  Component Date Value  . Bilirubin, Conjugated 06/09/2017 1.70*  . WBC (White Blood Cell Co* 06/09/2017 13.2*  . RBC (Red Blood Cell Coun* 06/09/2017 5.00   . Hemoglobin 06/09/2017 14.2   . Hematocrit 06/09/2017 42.4   . MCV (Mean Corpuscular Vo* 06/09/2017 84.8   . MCH (Mean Corpuscular He* 06/09/2017 28.4   . MCHC (Mean Corpuscular H* 06/09/2017 33.5   . Platelet Count 06/09/2017 328   . RDW-CV (Red Cell Distrib* 06/09/2017 14.2   . MPV (Mean Platelet Volum* 06/09/2017 8.8*  . Neutrophils 06/09/2017 9.61*  . Lymphocytes 06/09/2017 2.73    . Monocytes 06/09/2017 0.46   . Eosinophils 06/09/2017 0.31   . Basophils 06/09/2017 0.04   . Neutrophil % 06/09/2017 72.9*  . Lymphocyte % 06/09/2017 20.7   . Monocyte % 06/09/2017 3.5*  . Eosinophil % 06/09/2017 2.4   . Basophil% 06/09/2017 0.3   . Immature Granulocyte % 06/09/2017 0.2   . Immature Granulocyte Cou* 06/09/2017 0.03   . Glucose 06/09/2017 194*  . Sodium 06/09/2017 134*  . Potassium 06/09/2017 3.2*  . Chloride 06/09/2017 96*  . Carbon Dioxide (CO2) 06/09/2017 28.1   . Urea Nitrogen (BUN) 06/09/2017 32*  . Creatinine  06/09/2017 1.6*  . Glomerular Filtration Ra* 06/09/2017 45*  . Calcium 06/09/2017 9.4   . AST  06/09/2017 70*  . ALT  06/09/2017 138*  . Alk Phos (alkaline Phosp* 06/09/2017 260*  . Albumin 06/09/2017 4.1   . Bilirubin, Total 06/09/2017 3.1*  . Protein, Total 06/09/2017 7.8   . A/G Ratio 06/09/2017 1.1   Initial consult on 06/08/2017  Component Date Value  . WBC (White Blood Cell Co* 06/08/2017 12.8*  . RBC (Red Blood Cell Coun* 06/08/2017 5.21   . Hemoglobin 06/08/2017 14.8   . Hematocrit 06/08/2017 44.0   . MCV (Mean Corpuscular Vo* 06/08/2017 84.5   . MCH (Mean Corpuscular He* 06/08/2017 28.4   . MCHC (Mean Corpuscular H* 06/08/2017 33.6   . Platelet Count 06/08/2017 281   . RDW-CV (Red Cell Distrib* 06/08/2017 14.1   . MPV (Mean Platelet Volum* 06/08/2017 8.7*  . Neutrophils 06/08/2017 10.27*  . Lymphocytes 06/08/2017 1.44   . Monocytes 06/08/2017 0.90   . Eosinophils 06/08/2017 0.13   . Basophils 06/08/2017 0.04   . Neutrophil % 06/08/2017 80.3*  . Lymphocyte % 06/08/2017 11.2   . Monocyte % 06/08/2017 7.0   . Eosinophil % 06/08/2017 1.0   . Basophil% 06/08/2017 0.3   . Immature Granulocyte % 06/08/2017 0.2   . Immature Granulocyte Cou* 06/08/2017 0.03   . Glucose 06/08/2017 159*  . Sodium 06/08/2017 137   . Potassium 06/08/2017 3.2*  . Chloride 06/08/2017 96*  . Carbon Dioxide (CO2) 06/08/2017 32.5*  . Urea Nitrogen (BUN)  06/08/2017 17   . Creatinine 06/08/2017 1.3   . Glomerular Filtration Ra* 06/08/2017 57*  . Calcium 06/08/2017 9.3   . AST  06/08/2017 107*  . ALT  06/08/2017 180*  . Alk Phos (alkaline Phosp* 06/08/2017 265*  . Albumin 06/08/2017 4.2   . Bilirubin, Total 06/08/2017 8.7*  . Protein, Total 06/08/2017 7.9   . A/G Ratio 06/08/2017 1.1   . Bilirubin, Conjugated 06/08/2017 6.10*     ASSESSMENT: Mr. Robert Ross is a 56 y.o. male presenting for symptomatic cholelithiasis. Patient had an episode of choledocholithiasis with increased bilirubin. The stone most likely passed spontaneously because at the time of the MRCP there was no sign of stone on the CBD and no intra- or extra-hepatic duct dilation. The bilirubin was again re-checked after MRCP and decreased significantly, consistent with the resolution of the biliary duct obstruction. Patient continue asymptomatic since last visit.   Patient was oriented about all this findings and about the recommendations of surgical management to remove the gallbladder to avoid another episode of choledocholithiasis. Patient was oriented about surgical procedure (laparoscopic and open cholecystectomy), benefits and risks such as bile duct injury, bleeding, infection, hollow viscus organ injury, anesthesia complications (myocardial infarction, pneumonia, reaction to medications). Patient was oriented about the possibility of needing ERCP before or even after the surgery due to retained stone on the CBD.   PLAN: 1.Avoid fatty food 2.CBC, CMP next week 3.EKG, Internal Medicine clearance.  4.Continue antibiotics as prescribed 5.Follow Clinic Staff instruction regarding pre operative process.   Patient verbalized understanding, all questions were answered, and were agreeable with the plan outlined above.   Herbert Pun, MD

## 2017-06-27 ENCOUNTER — Inpatient Hospital Stay: Admission: RE | Admit: 2017-06-27 | Payer: 59 | Source: Ambulatory Visit

## 2017-06-28 ENCOUNTER — Encounter
Admission: RE | Admit: 2017-06-28 | Discharge: 2017-06-28 | Disposition: A | Discharge status: Home / Self Care | Payer: 59 | Source: Ambulatory Visit | Attending: General Surgery | Admitting: General Surgery

## 2017-06-28 HISTORY — DX: Personal history of urinary calculi: Z87.442

## 2017-06-28 HISTORY — DX: Pneumonia, unspecified organism: J18.9

## 2017-06-28 NOTE — Pre-Procedure Instructions (Signed)
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  DR Henrene HawkingKEPHART STATES OK TO PROCEED WITH SURGERY.

## 2017-06-28 NOTE — Patient Instructions (Signed)
  Your procedure is scheduled on: 07-05-17 Report to Same Day Surgery 2nd floor medical mall [facility](Medical Mall Entrance-take elevator on left to 2nd floor.  Check in with surgery information desk.) To find out your arrival time please call [phone] between 1PM - 3PM on 07-04-17  Remember: Instructions that are not followed completely may result in serious medical risk, up to and including death, or upon the discretion of your surgeon and anesthesiologist your surgery may need to be rescheduled.    _x___ 1. Do not eat food after midnight the night before your procedure. NO GUM CHEWING OR CANDY.  You may drink clear liquids up to 2 hours before you are scheduled to arrive at the hospital for your procedure.  Do not drink clear liquids within 2 hours of your scheduled arrival to the hospital.  Clear liquids include  --Water or Apple juice without pulp  --Clear carbohydrate beverage such as ClearFast or Gatorade  --Black Coffee or Clear Tea (No milk, no creamers, do not add anything to the coffee or Tea      __x__ 2. No Alcohol for 24 hours before or after surgery.   __x__3. No Smoking for 24 prior to surgery.   ____  4. Bring all medications with you on the day of surgery if instructed.    __x__ 5. Notify your doctor if there is any change in your medical condition     (cold, fever, infections).     Do not wear jewelry, make-up, hairpins, clips or nail polish.  Do not wear lotions, powders, or perfumes. You may wear deodorant.  Do not shave 48 hours prior to surgery. Men may shave face and neck.  Do not bring valuables to the hospital.    [facility] any belongings or valuables.               Contacts, dentures or bridgework may not be worn into surgery.  Leave your suitcase in the car. After surgery it may be brought to your room.  For patients admitted to the hospital, discharge time is determined by your treatment team.   Patients discharged the day of surgery  will not be allowed to drive home.  You will need someone to drive you home and stay with you the night of your procedure.    Please read over the following fact sheets that you were given:    ____ Take anti-hypertensive listed below, cardiac, seizure, asthma, anti-reflux and psychiatric medicines. These include:  1. NONE  2.  3.  4.  5.  6.  ____Fleets enema or Magnesium Citrate as directed.   ____ Use CHG Soap or sage wipes as directed on instruction sheet   ____ Use inhalers on the day of surgery and bring to hospital day of surgery  ____ Stop Metformin and Janumet 2 days prior to surgery.    ____ Take 1/2 of usual insulin dose the night before surgery and none on the morning surgery.   _x___ Follow recommendations from Cardiologist, Pulmonologist or PCP regarding stopping Aspirin, Coumadin, Plavix ,Eliquis, Effient, or Pradaxa, and Pletal-PT STOPPED ASA 2 WEEKS AGO  X____Stop Anti-inflammatories such as Advil, Aleve, Ibuprofen, Motrin, Naproxen, Naprosyn, Goodies powders or aspirin products NOW-OK to take Tylenol    ____ Stop supplements until after surgery.    ____ Bring C-Pap to the hospital.

## 2017-07-04 NOTE — Pre-Procedure Instructions (Signed)
CALLED OVER TO DR Verdie ShireFLEMINGS OFFICE TO SEE IF I COULD GET THE REPEAT CXR THAT WAS DONE ON 07-03-17.  ASHLEY STATES CXR WAS NOT DONE UNTIL 3:20 YESTERDAY AND THE CXR HAS NOT BEEN READ YET.  I HAVE THE CXR FROM 06-22-17 ON CHART.  CALLED EMILY AT DR CINTRONS OFFICE REGARDING THE CXR NOT BEING READ YET TO SEE IF SHE CAN GET THE RESULTS OF CXR.  EMILY STATES SHE HAS NOT HEARD ANYTHING AS OF YET AND WILL CALL ME BACK AS SOON AS SHE FINDS SOMETHING OUT.

## 2017-07-05 ENCOUNTER — Encounter
Admission: RE | Disposition: A | Discharge status: Home / Self Care | Payer: Self-pay | Source: Ambulatory Visit | Attending: General Surgery

## 2017-07-05 ENCOUNTER — Ambulatory Visit: Payer: 59 | Admitting: Anesthesiology

## 2017-07-05 ENCOUNTER — Ambulatory Visit
Admission: RE | Admit: 2017-07-05 | Discharge: 2017-07-05 | Disposition: A | Discharge status: Home / Self Care | Payer: 59 | Source: Ambulatory Visit | Attending: General Surgery | Admitting: General Surgery

## 2017-07-05 ENCOUNTER — Other Ambulatory Visit: Payer: Self-pay

## 2017-07-05 ENCOUNTER — Encounter: Payer: Self-pay | Admitting: *Deleted

## 2017-07-05 DIAGNOSIS — Z823 Family history of stroke: Secondary | ICD-10-CM | POA: Diagnosis not present

## 2017-07-05 DIAGNOSIS — Z8261 Family history of arthritis: Secondary | ICD-10-CM | POA: Diagnosis not present

## 2017-07-05 DIAGNOSIS — Z6834 Body mass index (BMI) 34.0-34.9, adult: Secondary | ICD-10-CM | POA: Insufficient documentation

## 2017-07-05 DIAGNOSIS — M109 Gout, unspecified: Secondary | ICD-10-CM | POA: Insufficient documentation

## 2017-07-05 DIAGNOSIS — M1990 Unspecified osteoarthritis, unspecified site: Secondary | ICD-10-CM | POA: Diagnosis not present

## 2017-07-05 DIAGNOSIS — Z8249 Family history of ischemic heart disease and other diseases of the circulatory system: Secondary | ICD-10-CM | POA: Diagnosis not present

## 2017-07-05 DIAGNOSIS — I889 Nonspecific lymphadenitis, unspecified: Secondary | ICD-10-CM | POA: Diagnosis not present

## 2017-07-05 DIAGNOSIS — Z87442 Personal history of urinary calculi: Secondary | ICD-10-CM | POA: Diagnosis not present

## 2017-07-05 DIAGNOSIS — Z79899 Other long term (current) drug therapy: Secondary | ICD-10-CM | POA: Insufficient documentation

## 2017-07-05 DIAGNOSIS — E669 Obesity, unspecified: Secondary | ICD-10-CM | POA: Diagnosis not present

## 2017-07-05 DIAGNOSIS — Z7982 Long term (current) use of aspirin: Secondary | ICD-10-CM | POA: Insufficient documentation

## 2017-07-05 DIAGNOSIS — K8064 Calculus of gallbladder and bile duct with chronic cholecystitis without obstruction: Secondary | ICD-10-CM | POA: Diagnosis not present

## 2017-07-05 DIAGNOSIS — Z833 Family history of diabetes mellitus: Secondary | ICD-10-CM | POA: Insufficient documentation

## 2017-07-05 DIAGNOSIS — J449 Chronic obstructive pulmonary disease, unspecified: Secondary | ICD-10-CM | POA: Diagnosis not present

## 2017-07-05 DIAGNOSIS — I10 Essential (primary) hypertension: Secondary | ICD-10-CM | POA: Insufficient documentation

## 2017-07-05 DIAGNOSIS — E785 Hyperlipidemia, unspecified: Secondary | ICD-10-CM | POA: Insufficient documentation

## 2017-07-05 DIAGNOSIS — Z8379 Family history of other diseases of the digestive system: Secondary | ICD-10-CM | POA: Insufficient documentation

## 2017-07-05 DIAGNOSIS — Z9889 Other specified postprocedural states: Secondary | ICD-10-CM | POA: Diagnosis not present

## 2017-07-05 DIAGNOSIS — Z9049 Acquired absence of other specified parts of digestive tract: Secondary | ICD-10-CM | POA: Diagnosis not present

## 2017-07-05 HISTORY — PX: CHOLECYSTECTOMY: SHX55

## 2017-07-05 SURGERY — LAPAROSCOPIC CHOLECYSTECTOMY
Anesthesia: General | Wound class: Clean Contaminated

## 2017-07-05 MED ORDER — SUGAMMADEX SODIUM 200 MG/2ML IV SOLN
INTRAVENOUS | Status: AC
  Filled 2017-07-05: qty 2

## 2017-07-05 MED ORDER — ONDANSETRON HCL 4 MG/2ML IJ SOLN
INTRAMUSCULAR | Status: DC | PRN
  Administered 2017-07-05: 4 mg via INTRAVENOUS

## 2017-07-05 MED ORDER — FENTANYL CITRATE (PF) 250 MCG/5ML IJ SOLN
INTRAMUSCULAR | Status: AC
  Filled 2017-07-05: qty 5

## 2017-07-05 MED ORDER — FAMOTIDINE 20 MG PO TABS
ORAL_TABLET | ORAL | Status: AC
  Administered 2017-07-05: 20 mg via ORAL
  Filled 2017-07-05: qty 1

## 2017-07-05 MED ORDER — FENTANYL CITRATE (PF) 100 MCG/2ML IJ SOLN: 25.0000 ug | INTRAMUSCULAR | Status: DC | PRN

## 2017-07-05 MED ORDER — TRAMADOL HCL 50 MG PO TABS: 50.0000 mg | ORAL_TABLET | Freq: Four times a day (QID) | ORAL | 0 refills | Status: AC | PRN

## 2017-07-05 MED ORDER — PROPOFOL 10 MG/ML IV BOLUS
INTRAVENOUS | Status: AC
  Filled 2017-07-05: qty 20

## 2017-07-05 MED ORDER — ROCURONIUM BROMIDE 50 MG/5ML IV SOLN
INTRAVENOUS | Status: AC
  Filled 2017-07-05: qty 1

## 2017-07-05 MED ORDER — KETOROLAC TROMETHAMINE 30 MG/ML IJ SOLN
INTRAMUSCULAR | Status: AC
  Filled 2017-07-05: qty 1

## 2017-07-05 MED ORDER — CEFAZOLIN SODIUM-DEXTROSE 2-4 GM/100ML-% IV SOLN
INTRAVENOUS | Status: AC
Start: 2017-07-05 — End: 2017-07-05
  Filled 2017-07-05: qty 100

## 2017-07-05 MED ORDER — CEFAZOLIN SODIUM-DEXTROSE 2-4 GM/100ML-% IV SOLN
2.0000 g | INTRAVENOUS | Status: AC
  Administered 2017-07-05: 2 g via INTRAVENOUS

## 2017-07-05 MED ORDER — ONDANSETRON HCL 4 MG/2ML IJ SOLN: 4.0000 mg | Freq: Once | INTRAMUSCULAR | Status: DC | PRN

## 2017-07-05 MED ORDER — LACTATED RINGERS IV SOLN
INTRAVENOUS | Status: DC
  Administered 2017-07-05 (×2): via INTRAVENOUS

## 2017-07-05 MED ORDER — PHENYLEPHRINE HCL 10 MG/ML IJ SOLN
INTRAMUSCULAR | Status: AC
  Filled 2017-07-05: qty 1

## 2017-07-05 MED ORDER — SUCCINYLCHOLINE CHLORIDE 20 MG/ML IJ SOLN
INTRAMUSCULAR | Status: DC | PRN
  Administered 2017-07-05: 100 mg via INTRAVENOUS

## 2017-07-05 MED ORDER — FENTANYL CITRATE (PF) 100 MCG/2ML IJ SOLN
INTRAMUSCULAR | Status: AC
  Filled 2017-07-05: qty 2

## 2017-07-05 MED ORDER — SUCCINYLCHOLINE CHLORIDE 20 MG/ML IJ SOLN
INTRAMUSCULAR | Status: AC
  Filled 2017-07-05: qty 1

## 2017-07-05 MED ORDER — SUGAMMADEX SODIUM 500 MG/5ML IV SOLN
INTRAVENOUS | Status: DC | PRN
  Administered 2017-07-05: 250 mg via INTRAVENOUS

## 2017-07-05 MED ORDER — ACETAMINOPHEN 10 MG/ML IV SOLN
INTRAVENOUS | Status: AC
  Filled 2017-07-05: qty 100

## 2017-07-05 MED ORDER — PROPOFOL 10 MG/ML IV BOLUS
INTRAVENOUS | Status: DC | PRN
  Administered 2017-07-05: 200 mg via INTRAVENOUS

## 2017-07-05 MED ORDER — MIDAZOLAM HCL 2 MG/2ML IJ SOLN
INTRAMUSCULAR | Status: AC
  Filled 2017-07-05: qty 2

## 2017-07-05 MED ORDER — LIDOCAINE HCL (PF) 2 % IJ SOLN
INTRAMUSCULAR | Status: AC
  Filled 2017-07-05: qty 10

## 2017-07-05 MED ORDER — EPHEDRINE SULFATE 50 MG/ML IJ SOLN
INTRAMUSCULAR | Status: AC
  Filled 2017-07-05: qty 1

## 2017-07-05 MED ORDER — MIDAZOLAM HCL 2 MG/2ML IJ SOLN
INTRAMUSCULAR | Status: DC | PRN
  Administered 2017-07-05: 2 mg via INTRAVENOUS

## 2017-07-05 MED ORDER — LIDOCAINE HCL (CARDIAC) 20 MG/ML IV SOLN
INTRAVENOUS | Status: DC | PRN
  Administered 2017-07-05: 100 mg via INTRAVENOUS

## 2017-07-05 MED ORDER — BUPIVACAINE-EPINEPHRINE 0.5% -1:200000 IJ SOLN
INTRAMUSCULAR | Status: DC | PRN
  Administered 2017-07-05: 12 mL

## 2017-07-05 MED ORDER — LABETALOL HCL 5 MG/ML IV SOLN
INTRAVENOUS | Status: AC
  Filled 2017-07-05: qty 4

## 2017-07-05 MED ORDER — LABETALOL HCL 5 MG/ML IV SOLN
INTRAVENOUS | Status: DC | PRN
  Administered 2017-07-05: 10 mg via INTRAVENOUS

## 2017-07-05 MED ORDER — FAMOTIDINE 20 MG PO TABS
20.0000 mg | ORAL_TABLET | Freq: Once | ORAL | Status: AC
  Administered 2017-07-05: 20 mg via ORAL

## 2017-07-05 MED ORDER — SUGAMMADEX SODIUM 500 MG/5ML IV SOLN
INTRAVENOUS | Status: AC
  Filled 2017-07-05: qty 5

## 2017-07-05 MED ORDER — BUPIVACAINE-EPINEPHRINE (PF) 0.5% -1:200000 IJ SOLN
INTRAMUSCULAR | Status: AC
  Filled 2017-07-05: qty 30

## 2017-07-05 MED ORDER — ONDANSETRON HCL 4 MG/2ML IJ SOLN
INTRAMUSCULAR | Status: AC
  Filled 2017-07-05: qty 2

## 2017-07-05 MED ORDER — DEXAMETHASONE SODIUM PHOSPHATE 10 MG/ML IJ SOLN
INTRAMUSCULAR | Status: DC | PRN
  Administered 2017-07-05: 10 mg via INTRAVENOUS

## 2017-07-05 MED ORDER — DEXAMETHASONE SODIUM PHOSPHATE 10 MG/ML IJ SOLN
INTRAMUSCULAR | Status: AC
  Filled 2017-07-05: qty 1

## 2017-07-05 MED ORDER — HYDROCODONE-ACETAMINOPHEN 7.5-325 MG PO TABS: 1.0000 | ORAL_TABLET | Freq: Four times a day (QID) | ORAL | Status: DC | PRN

## 2017-07-05 MED ORDER — ACETAMINOPHEN 10 MG/ML IV SOLN
INTRAVENOUS | Status: DC | PRN
  Administered 2017-07-05: 1000 mg via INTRAVENOUS

## 2017-07-05 MED ORDER — ROCURONIUM BROMIDE 100 MG/10ML IV SOLN
INTRAVENOUS | Status: DC | PRN
  Administered 2017-07-05: 15 mg via INTRAVENOUS
  Administered 2017-07-05: 50 mg via INTRAVENOUS
  Administered 2017-07-05: 5 mg via INTRAVENOUS
  Administered 2017-07-05 (×2): 10 mg via INTRAVENOUS

## 2017-07-05 MED ORDER — FENTANYL CITRATE (PF) 100 MCG/2ML IJ SOLN
INTRAMUSCULAR | Status: DC | PRN
  Administered 2017-07-05: 50 ug via INTRAVENOUS
  Administered 2017-07-05 (×2): 100 ug via INTRAVENOUS

## 2017-07-05 MED ORDER — GLYCOPYRROLATE 0.2 MG/ML IJ SOLN
INTRAMUSCULAR | Status: AC
  Filled 2017-07-05: qty 1

## 2017-07-05 SURGICAL SUPPLY — 58 items
APPLIER CLIP 5 13 M/L LIGAMAX5 (MISCELLANEOUS)
APPLIER CLIP LAPSCP 10X32 DD (CLIP) ×3 IMPLANT
BENZOIN TINCTURE PRP APPL 2/3 (GAUZE/BANDAGES/DRESSINGS) ×3 IMPLANT
BLADE SURG SZ11 CARB STEEL (BLADE) ×3 IMPLANT
CANISTER SUCT 1200ML W/VALVE (MISCELLANEOUS) ×3 IMPLANT
CHLORAPREP W/TINT 26ML (MISCELLANEOUS) ×3 IMPLANT
CLIP APPLIE 5 13 M/L LIGAMAX5 (MISCELLANEOUS) IMPLANT
CLOSURE WOUND 1/2 X4 (GAUZE/BANDAGES/DRESSINGS) ×1
CUTTER FLEX LINEAR 45M (STAPLE) ×3 IMPLANT
DERMABOND ADVANCED (GAUZE/BANDAGES/DRESSINGS) ×2
DERMABOND ADVANCED .7 DNX12 (GAUZE/BANDAGES/DRESSINGS) ×1 IMPLANT
DRAPE SHEET LG 3/4 BI-LAMINATE (DRAPES) ×3 IMPLANT
DRSG TEGADERM 2-3/8X2-3/4 SM (GAUZE/BANDAGES/DRESSINGS) ×12 IMPLANT
DRSG TELFA 3X8 NADH (GAUZE/BANDAGES/DRESSINGS) ×3 IMPLANT
ELECT E-Z MONOPOLAR 33 (MISCELLANEOUS) ×3
ELECT REM PT RETURN 9FT ADLT (ELECTROSURGICAL) ×3
ELECTRODE E-Z MONOPOLAR 33 (MISCELLANEOUS) ×1 IMPLANT
ELECTRODE REM PT RTRN 9FT ADLT (ELECTROSURGICAL) ×1 IMPLANT
GAUZE SPONGE 4X4 12PLY STRL (GAUZE/BANDAGES/DRESSINGS) IMPLANT
GLOVE BIO SURGEON STRL SZ 6.5 (GLOVE) ×6 IMPLANT
GLOVE BIO SURGEONS STRL SZ 6.5 (GLOVE) ×3
GLOVE BIOGEL PI IND STRL 6.5 (GLOVE) ×2 IMPLANT
GLOVE BIOGEL PI IND STRL 7.0 (GLOVE) ×1 IMPLANT
GLOVE BIOGEL PI INDICATOR 6.5 (GLOVE) ×4
GLOVE BIOGEL PI INDICATOR 7.0 (GLOVE) ×2
GOWN STRL REUS W/ TWL LRG LVL3 (GOWN DISPOSABLE) ×3 IMPLANT
GOWN STRL REUS W/TWL LRG LVL3 (GOWN DISPOSABLE) ×6
GRASPER SUT TROCAR 14GX15 (MISCELLANEOUS) ×3 IMPLANT
HEMOSTAT SURGICEL 2X14 (HEMOSTASIS) ×3 IMPLANT
HEMOSTAT SURGICEL 2X3 (HEMOSTASIS) ×3 IMPLANT
IRRIGATION STRYKERFLOW (MISCELLANEOUS) ×1 IMPLANT
IRRIGATOR STRYKERFLOW (MISCELLANEOUS) ×3
IV NS 1000ML (IV SOLUTION) ×2
IV NS 1000ML BAXH (IV SOLUTION) ×1 IMPLANT
KIT RM TURNOVER STRD PROC AR (KITS) ×3 IMPLANT
L-HOOK LAP DISP 36CM (ELECTROSURGICAL) ×3
LABEL OR SOLS (LABEL) ×3 IMPLANT
LHOOK LAP DISP 36CM (ELECTROSURGICAL) ×1 IMPLANT
NDL SAFETY 25GX1.5 (NEEDLE) ×3 IMPLANT
NEEDLE FILTER BLUNT 18X 1/2SAF (NEEDLE)
NEEDLE FILTER BLUNT 18X1 1/2 (NEEDLE) IMPLANT
NEEDLE INSUFFLATION 14GA 120MM (NEEDLE) ×3 IMPLANT
NS IRRIG 500ML POUR BTL (IV SOLUTION) ×3 IMPLANT
PACK LAP CHOLECYSTECTOMY (MISCELLANEOUS) ×3 IMPLANT
PENCIL ELECTRO HAND CTR (MISCELLANEOUS) ×3 IMPLANT
POUCH SPECIMEN RETRIEVAL 10MM (ENDOMECHANICALS) ×3 IMPLANT
RELOAD 45 VASCULAR/THIN (ENDOMECHANICALS) ×3 IMPLANT
SCISSORS METZENBAUM CVD 33 (INSTRUMENTS) ×3 IMPLANT
SLEEVE ENDOPATH XCEL 5M (ENDOMECHANICALS) ×3 IMPLANT
STRIP CLOSURE SKIN 1/2X4 (GAUZE/BANDAGES/DRESSINGS) ×2 IMPLANT
SUT MNCRL AB 4-0 PS2 18 (SUTURE) ×3 IMPLANT
SUT VIC AB 0 CT1 36 (SUTURE) ×3 IMPLANT
SUT VICRYL 0 UR6 27IN ABS (SUTURE) ×3 IMPLANT
SYR 3ML LL SCALE MARK (SYRINGE) IMPLANT
TROCAR XCEL 12X100 BLDLESS (ENDOMECHANICALS) ×3 IMPLANT
TROCAR XCEL NON-BLD 11X100MML (ENDOMECHANICALS) ×6 IMPLANT
TROCAR XCEL NON-BLD 5MMX100MML (ENDOMECHANICALS) ×3 IMPLANT
TUBING INSUFFLATOR HI FLOW (MISCELLANEOUS) ×3 IMPLANT

## 2017-07-05 NOTE — Anesthesia Postprocedure Evaluation (Signed)
Anesthesia Post Note  Patient: Robert LinLeslie Ross  Procedure(s) Performed: LAPAROSCOPIC CHOLECYSTECTOMY (N/A )  Patient location during evaluation: PACU Anesthesia Type: General Level of consciousness: awake and alert Pain management: pain level controlled Vital Signs Assessment: post-procedure vital signs reviewed and stable Respiratory status: spontaneous breathing and respiratory function stable Cardiovascular status: stable Anesthetic complications: no     Last Vitals:  Vitals:   07/05/17 1034 07/05/17 1040  BP: (!) 162/68 (!) 161/74  Pulse: 82   Resp: (!) 32   Temp: (!) 36.1 C   SpO2: 100%     Last Pain:  Vitals:   07/05/17 1034  TempSrc: Temporal                 KEPHART,WILLIAM K

## 2017-07-05 NOTE — Transfer of Care (Signed)
Immediate Anesthesia Transfer of Care Note  Patient: Robert LinLeslie Krasowski  Procedure(s) Performed: LAPAROSCOPIC CHOLECYSTECTOMY (N/A )  Patient Location: PACU  Anesthesia Type:General  Level of Consciousness: drowsy and patient cooperative  Airway & Oxygen Therapy: Patient Spontanous Breathing and Patient connected to nasal cannula oxygen  Post-op Assessment: Report given to RN and Post -op Vital signs reviewed and stable  Post vital signs: Reviewed and stable  Last Vitals:  Vitals:   07/05/17 0607 07/05/17 1034  BP: 135/77 (!) 162/68  Pulse: 82 82  Resp: 18 (!) 32  Temp: 36.9 C (!) 36.1 C  SpO2: 100% 100%    Last Pain:  Vitals:   07/05/17 1034  TempSrc: Temporal         Complications: No apparent anesthesia complications

## 2017-07-05 NOTE — Anesthesia Post-op Follow-up Note (Signed)
Anesthesia QCDR form completed.        

## 2017-07-05 NOTE — Interval H&P Note (Signed)
History and Physical Interval Note:  07/05/2017 6:57 AM  Robert Ross  has presented today for surgery, with the diagnosis of CALCULUS OF GALLBLADDER  The various methods of treatment have been discussed with the patient. After consideration of risks, benefits and other options for treatment, the patient has consented to  Procedure(s): LAPAROSCOPIC CHOLECYSTECTOMY (N/A) as a surgical intervention .  The patient's history has been reviewed, patient examined, no change in status, stable for surgery.  I have reviewed the patient's chart and labs.  Correct site of surgery was marked in the pre procedure room. Questions were answered to the patient's satisfaction.     Carolan ShiverEdgardo Cintron-Diaz

## 2017-07-05 NOTE — Discharge Instructions (Signed)
AMBULATORY SURGERY  DISCHARGE INSTRUCTIONS   1) The drugs that you were given will stay in your system until tomorrow so for the next 24 hours you should not:  A) Drive an automobile B) Make any legal decisions C) Drink any alcoholic beverage   2) You may resume regular meals tomorrow.  Today it is better to start with liquids and gradually work up to solid foods.  You may eat anything you prefer, but it is better to start with liquids, then soup and crackers, and gradually work up to solid foods.   3) Please notify your doctor immediately if you have any unusual bleeding, trouble breathing, redness and pain at the surgery site, drainage, fever, or pain not relieved by medication.    4) Additional Instructions:   Please contact your physician with any problems or Same Day Surgery at [phone], Monday through Friday 6 am to 4 pm, or [place] [facility] Main number at [phone].   Diet: Resume home heart healthy regular diet.   Activity: No heavy lifting >20 pounds (children, pets, laundry, garbage) or strenuous activity until follow-up, but light activity and walking are encouraged. Do not drive or drink alcohol if taking narcotic pain medications.  Wound care: Remove dressing in 1 day unless otherwise instructed. Once dressing removed, 1 day after surgery, may shower with soapy water and pat dry (do not rub incisions), but no baths or submerging incision underwater until follow-up. (no swimming)   Medications: Resume all home medications except aspiring for the next 48 hours. For mild to moderate pain: acetaminophen (Tylenol) or ibuprofen (if no kidney disease). Combining Tylenol with alcohol can substantially increase your risk of causing liver disease. Narcotic pain medications, if prescribed, can be used for severe pain, though may cause nausea, constipation, and drowsiness. Do not combine Tylenol and Percocet within a 6 hour period as Percocet contains Tylenol. If you do  not need the narcotic pain medication, you do not need to fill the prescription.  Call office [phone]) at any time if any questions, worsening pain, fevers/chills, bleeding, drainage from incision site, or other concerns.

## 2017-07-05 NOTE — Anesthesia Procedure Notes (Signed)
Procedure Name: Intubation Date/Time: 07/05/2017 7:29 AM Performed by: Marlana SalvageJessup, Perez Dirico, CRNA Pre-anesthesia Checklist: Patient identified, Emergency Drugs available, Suction available, Patient being monitored and Timeout performed Patient Re-evaluated:Patient Re-evaluated prior to induction Oxygen Delivery Method: Circle system utilized Preoxygenation: Pre-oxygenation with 100% oxygen Induction Type: IV induction Ventilation: Mask ventilation with difficulty, Oral airway inserted - appropriate to patient size and Two handed mask ventilation required Laryngoscope Size: McGraph and 4 Grade View: Grade I Tube type: Oral Tube size: 7.5 mm Number of attempts: 3 Airway Equipment and Method: Stylet and Video-laryngoscopy Placement Confirmation: ETT inserted through vocal cords under direct vision,  positive ETCO2 and breath sounds checked- equal and bilateral Secured at: 22 cm Tube secured with: Tape Dental Injury: Teeth and Oropharynx as per pre-operative assessment  Difficulty Due To: Difficulty was anticipated and Difficult Airway- due to anterior larynx

## 2017-07-05 NOTE — Op Note (Addendum)
Preoperative diagnosis: Chronic cholecystitis, History of choledocholithiasis  Postoperative diagnosis: Chronic cholecystitis, History of choledocholithiasis  Procedure: Laparoscopic Subtotal Cholecystectomy.   Anesthesia: GETA   Surgeon: Dr. Hazle Quant  Wound Classification: Clean Contaminated  Indications: Patient is a 56 y.o. male developed right upper quadrant pain nausea and leukocytosis and on workup was found to have bilirubin on 8. After MRCP that showed no sign of stone on the common bile duct, a repeat bilirubin level came down to 3 and eventual to 1. Abdominal ultrasound showed cholelithiasis with sings of chronic cholecystitis. Laparoscopic cholecystectomy was elected.  Findings: Very thick gallbladder with severe chronic inflammation Dome down technique done to separate the gallbladder from liver.  Gallbladder divided through infundibulum with endoscopic linear cutter Adequate hemostasis  Description of procedure: The patient was placed on the operating table in the supine position. General anesthesia was induced. A time-out was completed verifying correct patient, procedure, site, positioning, and implant(s) and/or special equipment prior to beginning this procedure. An orogastric tube was placed. The abdomen was prepped and draped in the usual sterile fashion.  An incision was made above the umbilicus. The fascia was elevated and the Veress needle inserted. Proper position was confirmed by aspiration and saline meniscus test.  The abdomen was insufflated with carbon dioxide to a pressure of 15 mmHg. The patient tolerated insufflation well. A 11-mm trocar was then inserted.  The laparoscope was inserted and the abdomen inspected. No injuries from initial trocar placement were noted. Additional trocars were then inserted in the following locations: a 11-mm trocar in the right epigastrium and two 5-mm trocars along the right costal margin. The abdomen was inspected and no  abnormalities were found. The table was placed in the reverse Trendelenburg position with the right side up.  Filmy thick adhesions were not able to be dissected sharply or with cautery unable to get a good plane around the infundibulum. The dome of the gallbladder was grasped with an atraumatic grasper and the gallbladder was separated from the liver with a dome down technique. The the gallbladder was separated from the liver the infundibulum was more clear. With retraction toward the patients feet, the cystic artery was identified going in the direction of the gallbladder. The cystic artery was ligated with clips and divided with scissors. After trying the get the dissection between the cystic duct on the Callot triangle and not able to get an good dissection plane due to the thick chronic inflammation, the posterior part of the gallbladder was opened. Making sure that the gallbladder was opened, an endoscopic linear stapler was used to divide the gallbladder through the infundibulum decreasing the risk of structures such a common bile duct and vasculature. The staple line was revised and no leak of bile was identified. The liver bed hemostasis was done with electrocautery. The right upper quadrant was thoroughly irrigated wit saline until clear water was aspirated. No sign of active bleeding or bile leak seen. The gallbladder and contained stones were removed using an endoscopic retrieval bag placed through the umbilical port. The gallbladder was passed off the table as a specimen.  Secondary trocars were removed under direct vision. No bleeding was noted. The laparoscope was withdrawn and the umbilical trocar removed. The abdomen was allowed to collapse. The fascia of the 11mm trocar sites was closed with figure-of-eight 0 vicryl sutures. The skin was closed with subcuticular sutures of 4-0 monocryl and topical skin adhesive. The orogastric tube was removed.  The patient tolerated the procedure well and was  taken to the postanesthesia care unit in stable condition.   Specimen: Gallbladder  Complications: None  EBL: 75mL

## 2017-07-05 NOTE — Anesthesia Preprocedure Evaluation (Signed)
Anesthesia Evaluation  Patient identified by MRN, date of birth, ID band Patient awake    Reviewed: Allergy & Precautions, NPO status , Patient's Chart, lab work & pertinent test results  History of Anesthesia Complications Negative for: history of anesthetic complications  Airway Mallampati: II       Dental   Pulmonary neg sleep apnea, neg COPD,           Cardiovascular hypertension, Pt. on medications (-) Past MI and (-) CHF (-) dysrhythmias (-) Valvular Problems/Murmurs     Neuro/Psych neg Seizures    GI/Hepatic Neg liver ROS, neg GERD  ,  Endo/Other  neg diabetes  Renal/GU negative Renal ROS     Musculoskeletal   Abdominal   Peds  Hematology   Anesthesia Other Findings   Reproductive/Obstetrics                             Anesthesia Physical Anesthesia Plan  ASA: II  Anesthesia Plan: General   Post-op Pain Management:    Induction: Intravenous  PONV Risk Score and Plan: 2 and Dexamethasone and Ondansetron  Airway Management Planned: Oral ETT  Additional Equipment:   Intra-op Plan:   Post-operative Plan:   Informed Consent: I have reviewed the patients History and Physical, chart, labs and discussed the procedure including the risks, benefits and alternatives for the proposed anesthesia with the patient or authorized representative who has indicated his/her understanding and acceptance.     Plan Discussed with:   Anesthesia Plan Comments:         Anesthesia Quick Evaluation

## 2017-07-07 LAB — SURGICAL PATHOLOGY

## 2018-06-21 ENCOUNTER — Other Ambulatory Visit: Payer: Self-pay | Admitting: Specialist

## 2018-06-21 DIAGNOSIS — R918 Other nonspecific abnormal finding of lung field: Secondary | ICD-10-CM

## 2018-07-10 ENCOUNTER — Ambulatory Visit
Admission: RE | Admit: 2018-07-10 | Discharge: 2018-07-10 | Disposition: A | Discharge status: Home / Self Care | Payer: BLUE CROSS/BLUE SHIELD | Source: Ambulatory Visit | Attending: Specialist | Admitting: Specialist

## 2018-07-10 DIAGNOSIS — I700 Atherosclerosis of aorta: Secondary | ICD-10-CM | POA: Diagnosis not present

## 2018-07-10 DIAGNOSIS — R918 Other nonspecific abnormal finding of lung field: Secondary | ICD-10-CM | POA: Insufficient documentation

## 2019-08-07 IMAGING — CT CT CHEST W/O CM
2 of 6 series · 14 of 36 positions shown, 18 images · non-contrast
Comparison: 05/10/2017. 11/15/2010.

CLINICAL DATA: Pulmonary nodule.

EXAM:
CT CHEST WITHOUT CONTRAST
TECHNIQUE: Multidetector CT imaging of the chest was performed following the
standard protocol without IV contrast.

[Series 2: thorax · axial · 0.75mm/px · z∈[-629,-335]mm · 13 of 163 slices shown, 17 images]
[im 8/163  mediastinal]
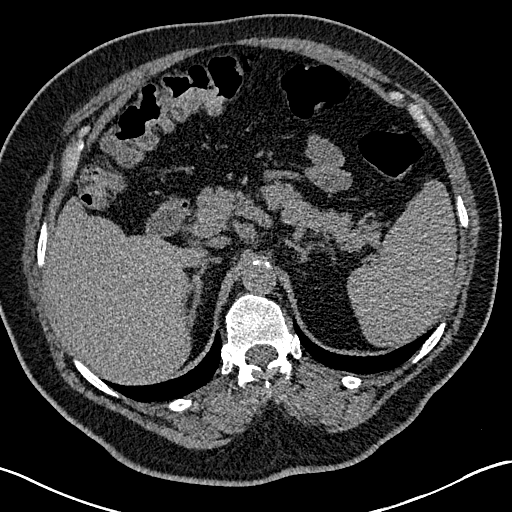
[im 8/163  lung]
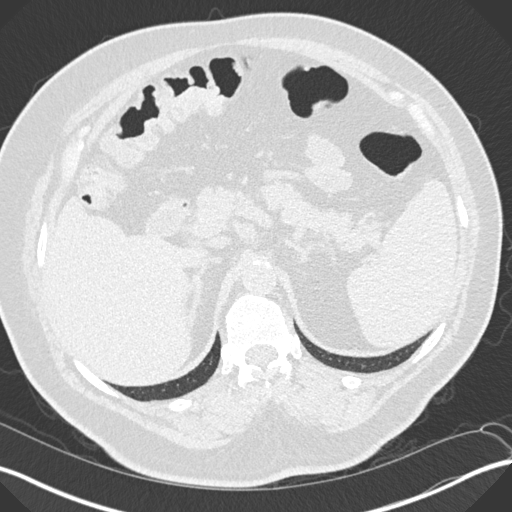
[im 23/163  lung]
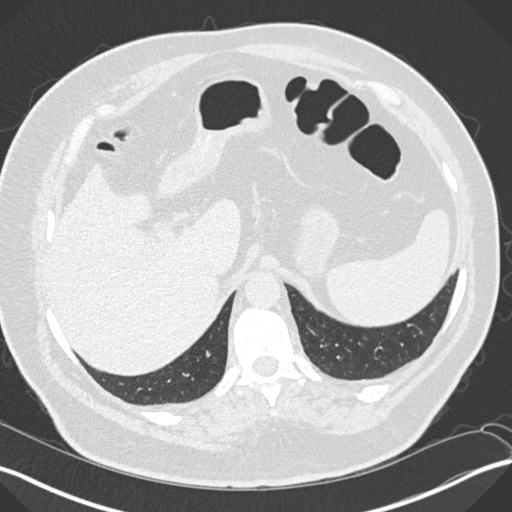
[im 37/163  lung]
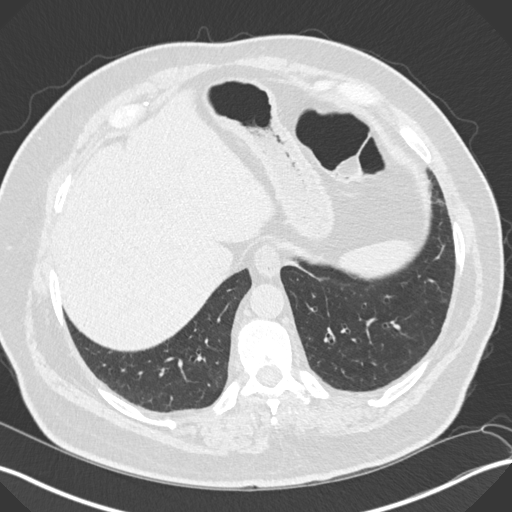
[im 45/163  lung]
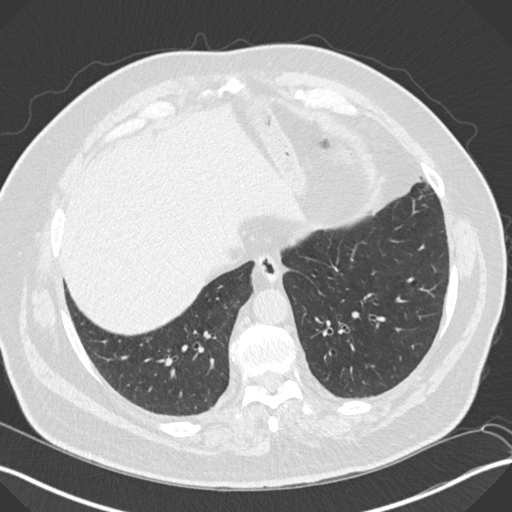
[im 59/163  mediastinal]
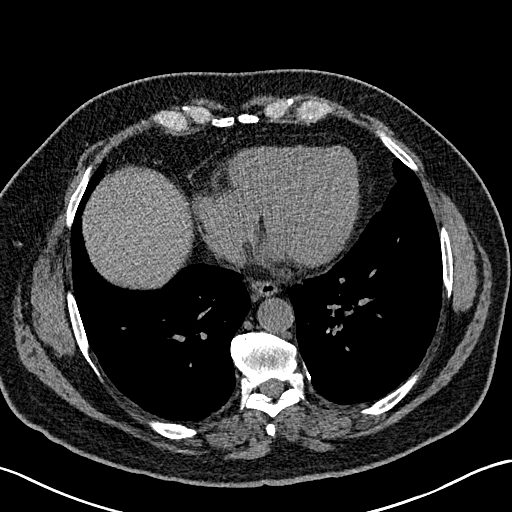
[im 59/163  lung]
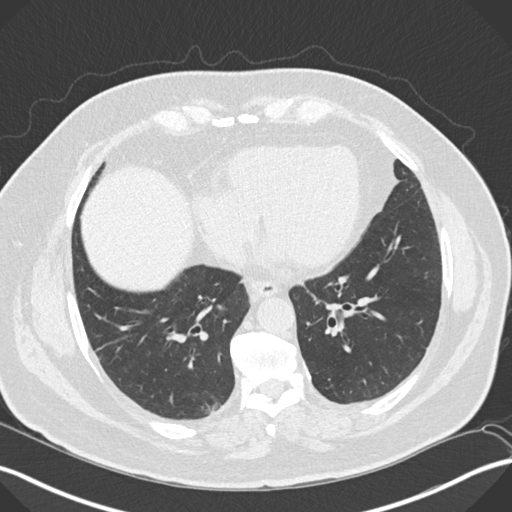
[im 67/163  lung]
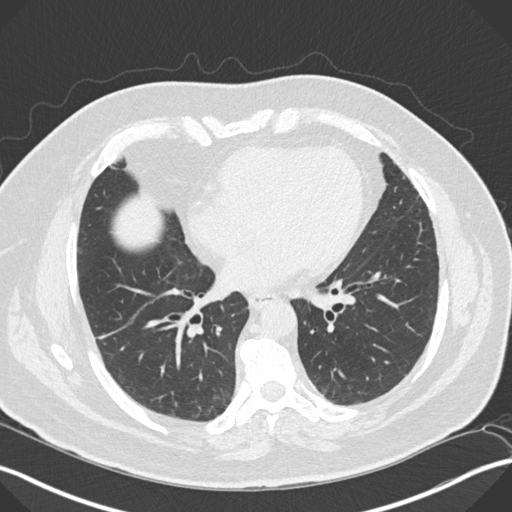
[im 82/163  lung]
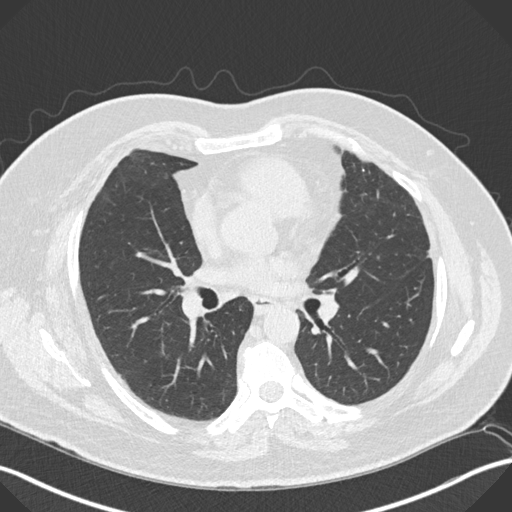
[im 96/163  lung]
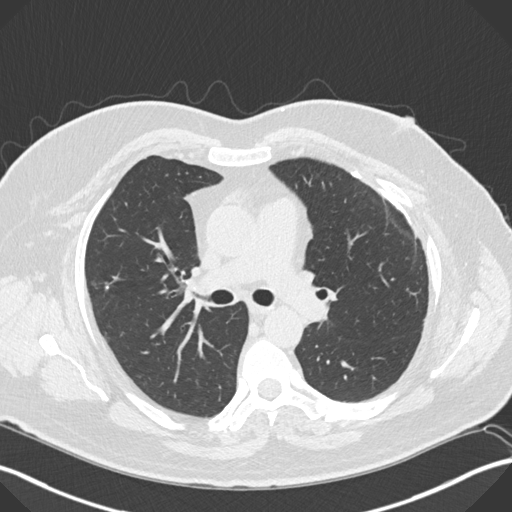
[im 104/163  mediastinal]
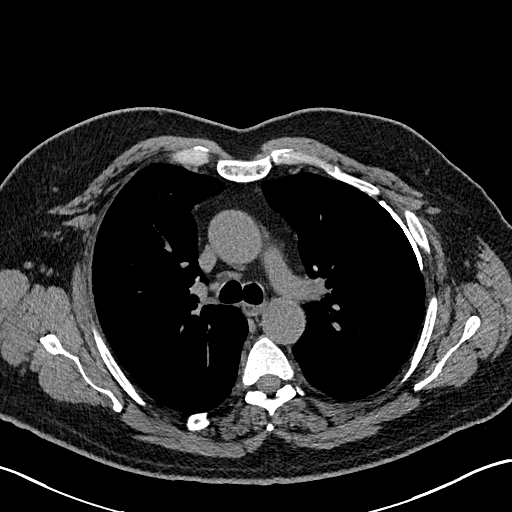
[im 104/163  lung]
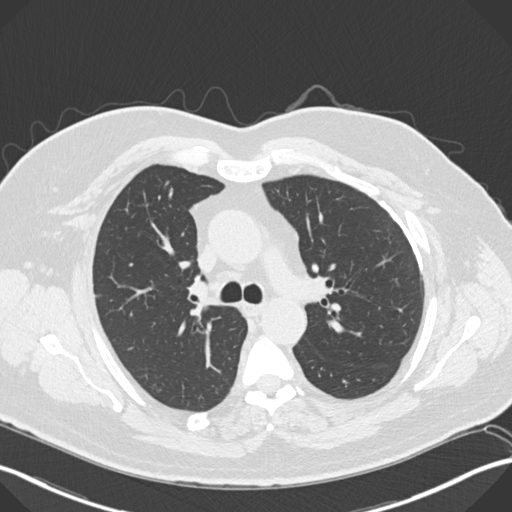
[im 118/163  lung]
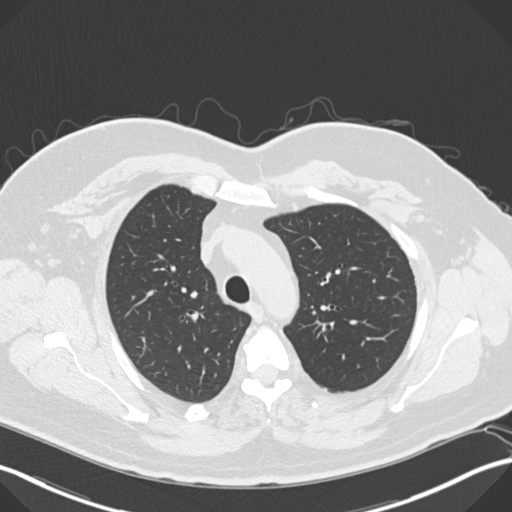
[im 126/163  lung]
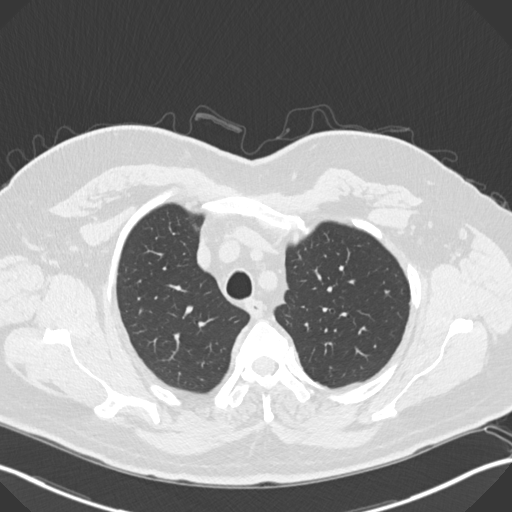
[im 140/163  lung]
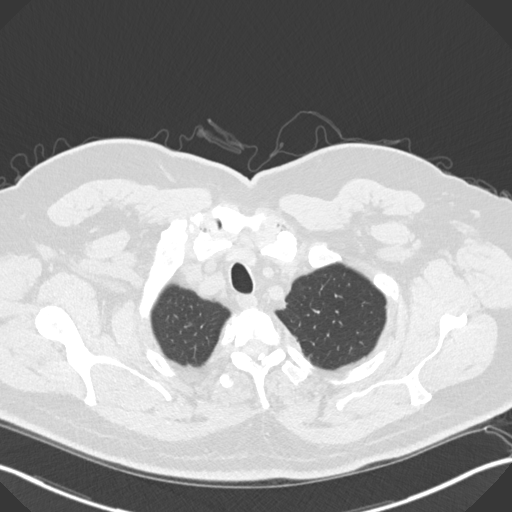
[im 155/163  mediastinal]
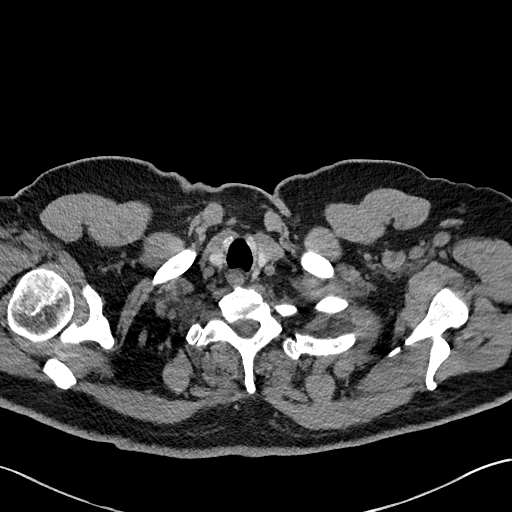
[im 155/163  lung]
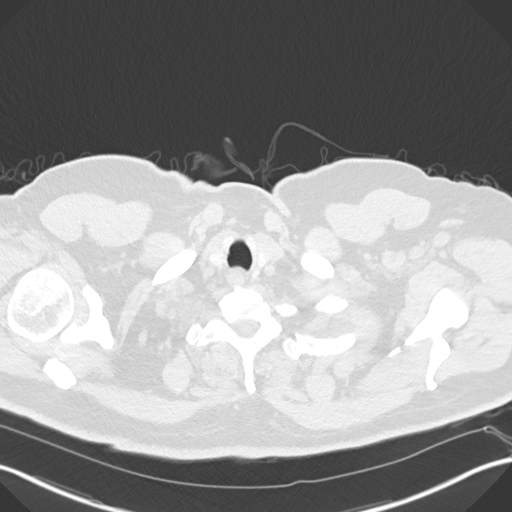

[Series 5: coronal · coronal · 0.65mm/px · 1 of 158 slices shown]
[im 79/158  lung]
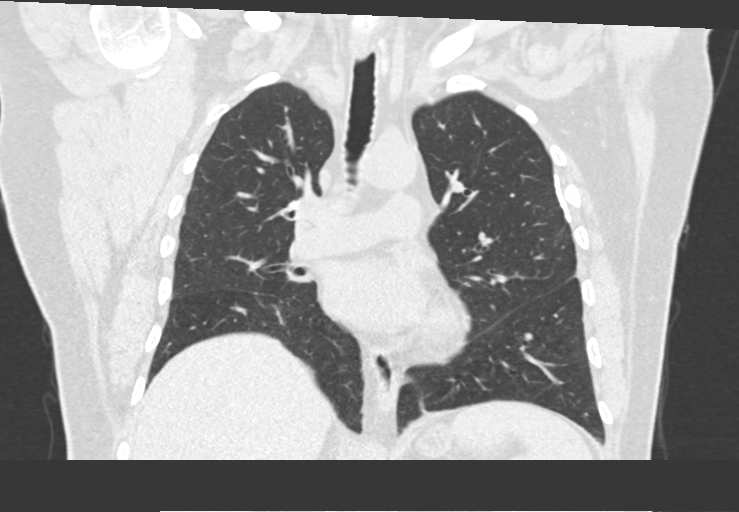

[14 of 36 positions shown; findings below may reference images not displayed]

FINDINGS: Cardiovascular: The heart size is normal. No substantial pericardial
effusion. Coronary artery calcification is evident. Atherosclerotic
calcification is noted in the wall of the thoracic aorta.

Mediastinum/Nodes: No mediastinal lymphadenopathy. No evidence for
gross hilar lymphadenopathy although assessment is limited by the
lack of intravenous contrast on today's study. The esophagus has
normal imaging features. There is no axillary lymphadenopathy.

Lungs/Pleura: The central tracheobronchial airways are patent. Right
middle lobe scarring is similar to the 9199 exam. Scattered
calcified granuloma noted in the right lung. 6 mm perifissural
consistent with benign etiology. 8 mm left lower lobe nodule (102/3)
is unchanged since 9199 as is the peripheral 5 mm left lower lobe
nodule seen on 87/3. Calcified pleural plaque in left hemithorax may
reflect prior hemothorax or empyema given unilateral involvement.

Upper Abdomen: Unremarkable.

Musculoskeletal: No worrisome lytic or sclerotic osseous
abnormality.
IMPRESSION: 1. Scattered tiny bilateral pulmonary nodule stable since 11/15/2010
and consistent with benign etiology.
2.  Aortic Atherosclerois ([id number])
3. Calcified pleural plaques in the left hemithorax. Given
unilateral involvement, prior empyema or hemothorax may be the
etiology.

## 2023-10-12 DIAGNOSIS — Z1211 Encounter for screening for malignant neoplasm of colon: Secondary | ICD-10-CM | POA: Diagnosis not present

## 2024-03-25 DIAGNOSIS — I10 Essential (primary) hypertension: Secondary | ICD-10-CM | POA: Diagnosis not present

## 2024-03-25 DIAGNOSIS — H6121 Impacted cerumen, right ear: Secondary | ICD-10-CM | POA: Diagnosis not present

## 2024-03-25 DIAGNOSIS — M1000 Idiopathic gout, unspecified site: Secondary | ICD-10-CM | POA: Diagnosis not present

## 2024-03-25 DIAGNOSIS — E782 Mixed hyperlipidemia: Secondary | ICD-10-CM | POA: Diagnosis not present

## 2024-03-25 DIAGNOSIS — E6609 Other obesity due to excess calories: Secondary | ICD-10-CM | POA: Diagnosis not present

## 2024-03-25 DIAGNOSIS — E66811 Obesity, class 1: Secondary | ICD-10-CM | POA: Diagnosis not present

## 2024-03-25 DIAGNOSIS — Z6832 Body mass index (BMI) 32.0-32.9, adult: Secondary | ICD-10-CM | POA: Diagnosis not present

## 2024-03-25 DIAGNOSIS — E876 Hypokalemia: Secondary | ICD-10-CM | POA: Diagnosis not present

## 2024-03-25 DIAGNOSIS — E119 Type 2 diabetes mellitus without complications: Secondary | ICD-10-CM | POA: Diagnosis not present
